# Patient Record
Sex: Male | Born: 1955
Health system: Southern US, Community
[De-identification: ages and names within clinical notes are randomized; demographics above are authoritative.]

## PROBLEM LIST (undated history)

## (undated) DIAGNOSIS — E669 Obesity, unspecified: Secondary | ICD-10-CM

## (undated) DIAGNOSIS — K635 Polyp of colon: Secondary | ICD-10-CM

## (undated) DIAGNOSIS — E119 Type 2 diabetes mellitus without complications: Secondary | ICD-10-CM

## (undated) DIAGNOSIS — M549 Dorsalgia, unspecified: Secondary | ICD-10-CM

## (undated) DIAGNOSIS — M4810 Ankylosing hyperostosis [Forestier], site unspecified: Secondary | ICD-10-CM

## (undated) DIAGNOSIS — F329 Major depressive disorder, single episode, unspecified: Secondary | ICD-10-CM

## (undated) DIAGNOSIS — I519 Heart disease, unspecified: Secondary | ICD-10-CM

## (undated) DIAGNOSIS — I10 Essential (primary) hypertension: Secondary | ICD-10-CM

## (undated) DIAGNOSIS — I4892 Unspecified atrial flutter: Secondary | ICD-10-CM

## (undated) DIAGNOSIS — F32A Depression, unspecified: Secondary | ICD-10-CM

## (undated) DIAGNOSIS — G4730 Sleep apnea, unspecified: Secondary | ICD-10-CM

## (undated) HISTORY — DX: Dorsalgia, unspecified: M54.9

## (undated) HISTORY — DX: Major depressive disorder, single episode, unspecified: F32.9

## (undated) HISTORY — DX: Depression, unspecified: F32.A

## (undated) HISTORY — DX: Polyp of colon: K63.5

## (undated) HISTORY — DX: Heart disease, unspecified: I51.9

## (undated) HISTORY — DX: Obesity, unspecified: E66.9

## (undated) HISTORY — PX: COLONOSCOPY: SHX174

## (undated) HISTORY — DX: Ankylosing hyperostosis (forestier), site unspecified: M48.10

## (undated) HISTORY — DX: Type 2 diabetes mellitus without complications: E11.9

---

## 2014-05-07 DIAGNOSIS — E785 Hyperlipidemia, unspecified: Secondary | ICD-10-CM | POA: Insufficient documentation

## 2014-08-07 DIAGNOSIS — N529 Male erectile dysfunction, unspecified: Secondary | ICD-10-CM | POA: Insufficient documentation

## 2015-02-18 DIAGNOSIS — I480 Paroxysmal atrial fibrillation: Secondary | ICD-10-CM | POA: Insufficient documentation

## 2015-05-09 DIAGNOSIS — R319 Hematuria, unspecified: Secondary | ICD-10-CM | POA: Insufficient documentation

## 2015-05-09 DIAGNOSIS — N281 Cyst of kidney, acquired: Secondary | ICD-10-CM | POA: Insufficient documentation

## 2015-05-09 DIAGNOSIS — N200 Calculus of kidney: Secondary | ICD-10-CM | POA: Insufficient documentation

## 2015-10-12 ENCOUNTER — Emergency Department (HOSPITAL_BASED_OUTPATIENT_CLINIC_OR_DEPARTMENT_OTHER)
Admission: EM | Admit: 2015-10-12 | Discharge: 2015-10-12 | Disposition: A | Discharge status: Home / Self Care | Payer: BLUE CROSS/BLUE SHIELD | Attending: Emergency Medicine | Admitting: Emergency Medicine

## 2015-10-12 ENCOUNTER — Encounter (HOSPITAL_BASED_OUTPATIENT_CLINIC_OR_DEPARTMENT_OTHER): Payer: Self-pay | Admitting: *Deleted

## 2015-10-12 DIAGNOSIS — E119 Type 2 diabetes mellitus without complications: Secondary | ICD-10-CM | POA: Diagnosis not present

## 2015-10-12 DIAGNOSIS — I10 Essential (primary) hypertension: Secondary | ICD-10-CM | POA: Diagnosis not present

## 2015-10-12 DIAGNOSIS — Z79899 Other long term (current) drug therapy: Secondary | ICD-10-CM | POA: Insufficient documentation

## 2015-10-12 DIAGNOSIS — R21 Rash and other nonspecific skin eruption: Secondary | ICD-10-CM | POA: Insufficient documentation

## 2015-10-12 DIAGNOSIS — Z8669 Personal history of other diseases of the nervous system and sense organs: Secondary | ICD-10-CM | POA: Diagnosis not present

## 2015-10-12 DIAGNOSIS — Z720 Tobacco use: Secondary | ICD-10-CM | POA: Insufficient documentation

## 2015-10-12 HISTORY — DX: Essential (primary) hypertension: I10

## 2015-10-12 HISTORY — DX: Unspecified atrial flutter: I48.92

## 2015-10-12 HISTORY — DX: Type 2 diabetes mellitus without complications: E11.9

## 2015-10-12 HISTORY — DX: Sleep apnea, unspecified: G47.30

## 2015-10-12 MED ORDER — FLUORESCEIN SODIUM 1 MG OP STRP
1.0000 | ORAL_STRIP | Freq: Once | OPHTHALMIC | Status: AC
  Administered 2015-10-12: 1 via OPHTHALMIC
  Filled 2015-10-12: qty 1

## 2015-10-12 MED ORDER — VALACYCLOVIR HCL 1 G PO TABS: 1000.0000 mg | ORAL_TABLET | Freq: Three times a day (TID) | ORAL | Status: DC

## 2015-10-12 MED ORDER — PROPARACAINE HCL 0.5 % OP SOLN
1.0000 [drp] | Freq: Once | OPHTHALMIC | Status: AC
  Administered 2015-10-12: 1 [drp] via OPHTHALMIC
  Filled 2015-10-12: qty 15

## 2015-10-12 MED ORDER — VALACYCLOVIR HCL 500 MG PO TABS
1000.0000 mg | ORAL_TABLET | Freq: Once | ORAL | Status: AC
  Administered 2015-10-12: 1000 mg via ORAL
  Filled 2015-10-12: qty 2

## 2015-10-12 NOTE — Discharge Instructions (Signed)
Do not hesitate to return to the emergency room for any new, worsening or concerning symptoms. ° °Please obtain primary care using resource guide below. Let them know that you were seen in the emergency room and that they will need to obtain records for further outpatient management. ° ° °

## 2015-10-12 NOTE — ED Provider Notes (Signed)
CSN: [id number]     Arrival date & time 10/12/15  1836 History   First MD Initiated Contact with Patient 10/12/15 1901     Chief Complaint  Patient presents with  . Rash     (Consider location/radiation/quality/duration/timing/severity/associated sxs/prior Treatment) HPI   Blood pressure 180/77, pulse 59, temperature 98.9 F (37.2 C), temperature source Oral, resp. rate 20, height  (1.778 m), weight 290 lb (131.543 kg), SpO2 99 %.  BHARATH BERNSTEIN is a 59 y.o. male complaining of painful and pruritic rash which she noticed on the crown of his head approximately 6 days ago. Pain is moderate, 5 out of 10 and alleviated with acetaminophen. States that he felt that his left eye is dry and he attributed that to a motorcycle ride he had been on. Is been applying over-the-counter eye moistening drops with little relief. He was applying hydrogen peroxide to the rash initially  because he thought was a bug bite. Lesions have spread downward to the left eyelid. Patient denies any change in his vision. Had chickenpox as a child. Denies fever, chills, nausea, vomiting.   Past Medical History  Diagnosis Date  . Hypertension   . Atrial flutter (HCC)   . Sleep apnea   . Diabetes mellitus without complication [facility])    Past Surgical History  Procedure Laterality Date  . Colonoscopy     No family history on file. Social History  Substance Use Topics  . Smoking status: Current Some Day Smoker    Types: Cigars  . Smokeless tobacco: Never Used  . Alcohol Use: Yes     Comment: 3x week    Review of Systems  10 systems reviewed and found to be negative, except as noted in the HPI.   Allergies  Review of patient's allergies indicates no known allergies.  Home Medications   Prior to Admission medications   Medication Sig Start Date End Date Taking? Authorizing Provider  Apixaban (ELIQUIS PO) Take by mouth.   Yes Historical Provider, MD  sitaGLIPtin-metformin (JANUMET) 50-1000 MG  tablet Take 1 tablet by mouth daily.   Yes Historical Provider, MD   BP 180/77 mmHg  Pulse 59  Temp(Src) 98.9 F (37.2 C) (Oral)  Resp 20  Ht  (1.778 m)  Wt 290 lb (131.543 kg)  BMI 41.61 kg/m2  SpO2 99% Physical Exam  Constitutional: He is oriented to person, place, and time. He appears well-developed and well-nourished. No distress.  HENT:  Head: Normocephalic.    Eyes: Conjunctivae, EOM and lids are normal. Pupils are equal, round, and reactive to light.  No ferning or abnormal uptake on fluorescein stain of the left eye  Cardiovascular: Normal rate.   Pulmonary/Chest: Effort normal. No stridor.  Musculoskeletal: Normal range of motion.  Neurological: He is alert and oriented to person, place, and time.  Skin: Rash noted.  Lesions spare the palms soles or mucous membranes.  Psychiatric: He has a normal mood and affect.  Nursing note and vitals reviewed.   ED Course  Procedures (including critical care time) Labs Review Labs Reviewed - No data to display  Imaging Review No results found. I have personally reviewed and evaluated these images and lab results as part of my medical decision-making.   EKG Interpretation None      MDM   Final diagnoses:  Rash and nonspecific skin eruption    Filed Vitals:   10/12/15 1842  BP: 180/77  Pulse: 59  Temp: 98.9 F (37.2 C)  TempSrc: Oral  Resp: 20  Height: 5\' 10"  (1.778 m)  Weight: 290 lb (131.543 kg)  SpO2: 99%    Medications  proparacaine (ALCAINE) 0.5 % ophthalmic solution 1 drop (1 drop Left Eye Given 10/12/15 1945)  fluorescein ophthalmic strip 1 strip (1 strip Left Eye Given 10/12/15 1946)  valACYclovir (VALTREX) tablet 1,000 mg (1,000 mg Oral Given 10/12/15 1944)    Andria RheinJohn W Rodrigue is 59 y.o. male presenting with mild to moderate painful rash starting on the left frontal area. Patient has sensation of dry left eye and also he has a hive-like lesion on the left eyelid. No lesions on the nose or in  the ear canal. Fluorescein stain of the left eye without ferning. Patient's pain is mild, this may be shingles but I think it's unlikely. Patient will be started on Valtrex. States that [place] moved to [place], [place] [facility] [place] [facility].  Evaluation does not show pathology that would require ongoing emergent intervention or inpatient treatment. Pt is hemodynamically stable and mentating appropriately. Discussed findings and plan with patient/guardian, who agrees with care plan. All questions answered. Return precautions discussed and outpatient follow up given.   New Prescriptions   VALACYCLOVIR (VALTREX) 1000 MG TABLET    Take 1 tablet (1,000 mg total) by mouth 3 (three) times daily.         Wynetta Emeryicole Pandora Mccrackin, PA-C 10/12/15 1950  Rolan BuccoMelanie Belfi, MD 10/12/15 [phone]

## 2015-10-12 NOTE — ED Notes (Signed)
Head pain and rash x 6 days

## 2015-10-18 ENCOUNTER — Encounter: Payer: Self-pay | Admitting: Medical

## 2015-10-18 ENCOUNTER — Encounter (INDEPENDENT_AMBULATORY_CARE_PROVIDER_SITE_OTHER): Payer: Self-pay

## 2015-10-18 ENCOUNTER — Ambulatory Visit (INDEPENDENT_AMBULATORY_CARE_PROVIDER_SITE_OTHER): Payer: BLUE CROSS/BLUE SHIELD | Admitting: Medical

## 2015-10-18 VITALS — BP 120/80 | HR 67 | Temp 97.8°F | Wt 295.0 lb

## 2015-10-18 DIAGNOSIS — G629 Polyneuropathy, unspecified: Secondary | ICD-10-CM

## 2015-10-18 DIAGNOSIS — B029 Zoster without complications: Secondary | ICD-10-CM

## 2015-10-18 DIAGNOSIS — G4730 Sleep apnea, unspecified: Secondary | ICD-10-CM

## 2015-10-18 DIAGNOSIS — M792 Neuralgia and neuritis, unspecified: Secondary | ICD-10-CM

## 2015-10-18 DIAGNOSIS — I10 Essential (primary) hypertension: Secondary | ICD-10-CM | POA: Insufficient documentation

## 2015-10-18 MED ORDER — TRAMADOL HCL 50 MG PO TABS: 50.0000 mg | ORAL_TABLET | Freq: Three times a day (TID) | ORAL | Status: DC | PRN

## 2015-10-18 NOTE — Progress Notes (Signed)
Pre visit review using our clinic review tool, if applicable. No additional management support is needed unless otherwise documented below in the visit note. 

## 2015-10-18 NOTE — Assessment & Plan Note (Signed)
Will give tramadol if needed for severe pain. Maybe just use at night.  Also we could give med such as neurontin if daily pain not improving.

## 2015-10-18 NOTE — Assessment & Plan Note (Signed)
-   Continue amlodipine ?

## 2015-10-18 NOTE — Progress Notes (Signed)
Subjective:    Patient ID: Luke Sims, male    DOB: October 16, 1956, 59 y.o.   MRN: [id number]  HPI  Pt in with some recent shingles. Pt had shingles on top of his scalp. Pt had some radiation from top of his scalp around toward his ear toward his lt cheek(this mild pain that comes and goes). No eye pain. No visual distrubance.. Pt has been on Valtrex since Saturday. Pt had mild rash for about a week.   Pt states he get occasional sharp pain at times. Level of pain is about 4/10 on average.    Pt given 15 day course of famvir.  Pt has seen Dr. Loni Musepthalmologist Sims. Eye was ok per pt.  Pt is diabetic- pt is on jaumet. Last a1-c was 7.1.  Pt has history of atrial flutter- end of march. Had cardioversion. One follow up with cardiologist.  Sees [facility] cardiologist. Luke Sims. [phone].  Sleep apnea- Pt uses cpap for 17 yrs.  Colonosocpy- Pt states small polyps. Told to repeat in 3 years.  Pt works at [employer], exercise walk/run intervals, 1 cup decaffeinated a day, 3 cigars a month, married- 3 children.    Review of Systems  Constitutional: Negative for fever, chills and fatigue.  Respiratory: Negative for cough, chest tightness, shortness of breath and wheezing.   Cardiovascular: Negative for chest pain and palpitations.  Gastrointestinal: Negative for abdominal pain.  Musculoskeletal: Negative for back pain.  Neurological: Negative for dizziness and headaches.       Neuroptahy type pain on scalp.  Hematological: Negative for adenopathy. Does not bruise/bleed easily.  Psychiatric/Behavioral: Negative for confusion and agitation.    Past Medical History  Diagnosis Date  . Hypertension   . Atrial flutter (HCC)   . Sleep apnea   . Diabetes mellitus without complication [facility](HCC)     Social History   Social History  . Marital Status: Married    Spouse Name: N/A  . Number of Children: N/A  . Years of Education: N/A   Occupational History  . Not on file.    Social History Main Topics  . Smoking status: Current Some Day Smoker    Types: Cigars  . Smokeless tobacco: Never Used  . Alcohol Use: Yes     Comment: 3x week  . Drug Use: No  . Sexual Activity: Not on file   Other Topics Concern  . Not on file   Social History Narrative    Past Surgical History  Procedure Laterality Date  . Colonoscopy      Family History  Problem Relation Age of Onset  . Diabetes Mother   . Heart disease Father   . Hyperlipidemia Father   . Diabetes Sister   . COPD Brother     No Known Allergies  Current Outpatient Prescriptions on File Prior to Visit  Medication Sig Dispense Refill  . sitaGLIPtin-metformin (JANUMET) 50-1000 MG tablet Take 1 tablet by mouth daily.    . valACYclovir (VALTREX) 1000 MG tablet Take 1 tablet (1,000 mg total) by mouth 3 (three) times daily. 21 tablet 0   No current facility-administered medications on file prior to visit.    BP 120/80 mmHg  Pulse 67  Temp(Src) 97.8 F (36.6 C) (Oral)  Wt 295 lb (133.811 kg)  SpO2 98%       Objective:   Physical Exam  General Mental Status- Alert. General Appearance- Not in acute distress.   Skin General: Color- Normal Color. Moisture- Normal Moisture. Only faint  dry appearance to scalp flaky appearance,  As well as faint flaky skin appearance upper eye lid  and above eye brow. No vesicles.  Neck Carotid Arteries- Normal color. Moisture- Normal Moisture. No carotid bruits. No JVD.  Chest and Lung Exam Auscultation: Breath Sounds:-Normal.  Cardiovascular Auscultation:Rythm- Regular. Murmurs & Other Heart Sounds:Auscultation of the heart reveals- No Murmurs.  Neurologic Cranial Nerve exam:- CN III-XII intact(No nystagmus), symmetric smile. Strength:- 5/5 equal and symmetric strength both upper and lower extremities.      Assessment & Plan:  Continue with the famvir. Follow up eye MD.  Will give tramadol if needed for severe pain. Maybe just use at  night.  Also we could give med such as neurontin if daily pain not improving.  zostavax  In near future after he gets over acute illness. Also he will talk with his insurance make sure they will cover.  Follow up in 3-4 weeks or as needed  Pt will continue meds for his chronic problems.

## 2015-10-18 NOTE — Assessment & Plan Note (Signed)
Continue with the famvir. Follow up eye MD.

## 2015-10-18 NOTE — Assessment & Plan Note (Signed)
Pt uses cpap for 17 yrs.

## 2015-10-18 NOTE — Patient Instructions (Signed)
Continue with the famvir. Follow up eye MD.  Will give tramadol if needed for severe pain. Maybe just use at night.  Also we could give med such as neurontin if daily pain not improving.  Follow up in 3-4 weeks or as needed

## 2015-11-07 ENCOUNTER — Ambulatory Visit: Payer: BLUE CROSS/BLUE SHIELD | Admitting: Medical

## 2016-01-03 DIAGNOSIS — K760 Fatty (change of) liver, not elsewhere classified: Secondary | ICD-10-CM | POA: Insufficient documentation

## 2016-01-03 DIAGNOSIS — Z8679 Personal history of other diseases of the circulatory system: Secondary | ICD-10-CM | POA: Insufficient documentation

## 2016-01-03 DIAGNOSIS — E785 Hyperlipidemia, unspecified: Secondary | ICD-10-CM | POA: Insufficient documentation

## 2016-01-03 DIAGNOSIS — E1169 Type 2 diabetes mellitus with other specified complication: Secondary | ICD-10-CM | POA: Insufficient documentation

## 2016-01-03 DIAGNOSIS — K8020 Calculus of gallbladder without cholecystitis without obstruction: Secondary | ICD-10-CM | POA: Insufficient documentation

## 2016-01-03 DIAGNOSIS — E291 Testicular hypofunction: Secondary | ICD-10-CM | POA: Insufficient documentation

## 2016-01-03 DIAGNOSIS — E559 Vitamin D deficiency, unspecified: Secondary | ICD-10-CM | POA: Insufficient documentation

## 2016-01-03 DIAGNOSIS — E041 Nontoxic single thyroid nodule: Secondary | ICD-10-CM | POA: Insufficient documentation

## 2016-01-24 ENCOUNTER — Ambulatory Visit: Payer: BLUE CROSS/BLUE SHIELD | Admitting: Family Medicine

## 2016-07-17 DIAGNOSIS — R809 Proteinuria, unspecified: Secondary | ICD-10-CM | POA: Insufficient documentation

## 2016-07-17 DIAGNOSIS — E1129 Type 2 diabetes mellitus with other diabetic kidney complication: Secondary | ICD-10-CM | POA: Insufficient documentation

## 2016-10-26 ENCOUNTER — Other Ambulatory Visit: Payer: Self-pay | Admitting: Chiropractic Medicine

## 2016-10-26 ENCOUNTER — Ambulatory Visit
Admission: RE | Admit: 2016-10-26 | Discharge: 2016-10-26 | Disposition: A | Discharge status: Home / Self Care | Payer: BLUE CROSS/BLUE SHIELD | Source: Ambulatory Visit | Attending: Chiropractic Medicine | Admitting: Chiropractic Medicine

## 2016-10-26 DIAGNOSIS — W19XXXA Unspecified fall, initial encounter: Secondary | ICD-10-CM

## 2016-11-13 ENCOUNTER — Other Ambulatory Visit: Payer: Self-pay | Admitting: Chiropractic Medicine

## 2016-11-13 ENCOUNTER — Ambulatory Visit
Admission: RE | Admit: 2016-11-13 | Discharge: 2016-11-13 | Disposition: A | Discharge status: Home / Self Care | Payer: BLUE CROSS/BLUE SHIELD | Source: Ambulatory Visit | Attending: Chiropractic Medicine | Admitting: Chiropractic Medicine

## 2016-11-13 DIAGNOSIS — W19XXXA Unspecified fall, initial encounter: Secondary | ICD-10-CM

## 2016-12-04 ENCOUNTER — Other Ambulatory Visit: Payer: Self-pay | Admitting: Chiropractic Medicine

## 2016-12-04 DIAGNOSIS — M545 Low back pain: Secondary | ICD-10-CM

## 2016-12-14 ENCOUNTER — Ambulatory Visit
Admission: RE | Admit: 2016-12-14 | Discharge: 2016-12-14 | Disposition: A | Discharge status: Home / Self Care | Payer: BLUE CROSS/BLUE SHIELD | Source: Ambulatory Visit | Attending: Chiropractic Medicine | Admitting: Chiropractic Medicine

## 2016-12-14 DIAGNOSIS — M545 Low back pain: Secondary | ICD-10-CM

## 2016-12-28 HISTORY — PX: CARDIAC CATHETERIZATION: SHX172

## 2017-01-07 DIAGNOSIS — M4810 Ankylosing hyperostosis [Forestier], site unspecified: Secondary | ICD-10-CM | POA: Insufficient documentation

## 2017-01-07 DIAGNOSIS — K439 Ventral hernia without obstruction or gangrene: Secondary | ICD-10-CM | POA: Insufficient documentation

## 2017-01-14 DIAGNOSIS — K148 Other diseases of tongue: Secondary | ICD-10-CM | POA: Insufficient documentation

## 2017-01-18 DIAGNOSIS — Z955 Presence of coronary angioplasty implant and graft: Secondary | ICD-10-CM | POA: Insufficient documentation

## 2017-02-08 ENCOUNTER — Telehealth (HOSPITAL_COMMUNITY): Payer: Self-pay | Admitting: *Deleted

## 2017-02-08 NOTE — Telephone Encounter (Signed)
Returned call from message pt left earlier today for cardiac rehab. Unsure [facility]. Contact information provided.  Await return call. Alanson Alyarlette Elius Etheredge RN, BSN

## 2017-03-09 ENCOUNTER — Telehealth (HOSPITAL_COMMUNITY): Payer: Self-pay | Admitting: *Deleted

## 2017-03-09 NOTE — Telephone Encounter (Signed)
Follow up phone call placed to pt.  Pt indicated he is exercising at [facility].  No further needs. Alanson Alyarlette Carlton RN, BSN

## 2018-03-29 DIAGNOSIS — R0789 Other chest pain: Secondary | ICD-10-CM | POA: Insufficient documentation

## 2018-04-14 DIAGNOSIS — R109 Unspecified abdominal pain: Secondary | ICD-10-CM | POA: Insufficient documentation

## 2018-04-14 DIAGNOSIS — G4701 Insomnia due to medical condition: Secondary | ICD-10-CM | POA: Insufficient documentation

## 2018-04-14 DIAGNOSIS — T148XXA Other injury of unspecified body region, initial encounter: Secondary | ICD-10-CM | POA: Insufficient documentation

## 2018-05-04 DIAGNOSIS — F17290 Nicotine dependence, other tobacco product, uncomplicated: Secondary | ICD-10-CM | POA: Diagnosis not present

## 2018-05-04 DIAGNOSIS — E669 Obesity, unspecified: Secondary | ICD-10-CM | POA: Diagnosis not present

## 2018-05-04 DIAGNOSIS — R5383 Other fatigue: Secondary | ICD-10-CM | POA: Diagnosis not present

## 2018-05-04 DIAGNOSIS — R0683 Snoring: Secondary | ICD-10-CM | POA: Diagnosis not present

## 2018-05-04 DIAGNOSIS — Z7984 Long term (current) use of oral hypoglycemic drugs: Secondary | ICD-10-CM | POA: Diagnosis not present

## 2018-05-04 DIAGNOSIS — E119 Type 2 diabetes mellitus without complications: Secondary | ICD-10-CM | POA: Diagnosis not present

## 2018-05-04 DIAGNOSIS — Z5189 Encounter for other specified aftercare: Secondary | ICD-10-CM | POA: Diagnosis not present

## 2018-05-04 DIAGNOSIS — G4730 Sleep apnea, unspecified: Secondary | ICD-10-CM | POA: Diagnosis not present

## 2018-05-04 DIAGNOSIS — E1129 Type 2 diabetes mellitus with other diabetic kidney complication: Secondary | ICD-10-CM | POA: Diagnosis not present

## 2018-05-04 DIAGNOSIS — E041 Nontoxic single thyroid nodule: Secondary | ICD-10-CM | POA: Diagnosis not present

## 2018-05-04 DIAGNOSIS — E042 Nontoxic multinodular goiter: Secondary | ICD-10-CM | POA: Diagnosis not present

## 2018-05-04 DIAGNOSIS — Z6841 Body Mass Index (BMI) 40.0 and over, adult: Secondary | ICD-10-CM | POA: Diagnosis not present

## 2018-05-04 DIAGNOSIS — E1169 Type 2 diabetes mellitus with other specified complication: Secondary | ICD-10-CM | POA: Diagnosis not present

## 2018-05-05 DIAGNOSIS — M545 Low back pain: Secondary | ICD-10-CM | POA: Diagnosis not present

## 2018-05-05 DIAGNOSIS — J90 Pleural effusion, not elsewhere classified: Secondary | ICD-10-CM | POA: Insufficient documentation

## 2018-05-05 DIAGNOSIS — M546 Pain in thoracic spine: Secondary | ICD-10-CM | POA: Diagnosis not present

## 2018-05-20 DIAGNOSIS — R112 Nausea with vomiting, unspecified: Secondary | ICD-10-CM | POA: Diagnosis not present

## 2018-05-20 DIAGNOSIS — R42 Dizziness and giddiness: Secondary | ICD-10-CM | POA: Diagnosis not present

## 2018-05-23 DIAGNOSIS — I4510 Unspecified right bundle-branch block: Secondary | ICD-10-CM | POA: Diagnosis not present

## 2018-05-23 DIAGNOSIS — R9431 Abnormal electrocardiogram [ECG] [EKG]: Secondary | ICD-10-CM | POA: Diagnosis not present

## 2018-06-09 DIAGNOSIS — H6981 Other specified disorders of Eustachian tube, right ear: Secondary | ICD-10-CM | POA: Diagnosis not present

## 2018-06-09 DIAGNOSIS — E119 Type 2 diabetes mellitus without complications: Secondary | ICD-10-CM | POA: Diagnosis not present

## 2018-06-09 DIAGNOSIS — H9313 Tinnitus, bilateral: Secondary | ICD-10-CM | POA: Diagnosis not present

## 2018-06-09 DIAGNOSIS — H903 Sensorineural hearing loss, bilateral: Secondary | ICD-10-CM | POA: Diagnosis not present

## 2018-06-23 DIAGNOSIS — Z7984 Long term (current) use of oral hypoglycemic drugs: Secondary | ICD-10-CM | POA: Diagnosis not present

## 2018-06-23 DIAGNOSIS — H5213 Myopia, bilateral: Secondary | ICD-10-CM | POA: Diagnosis not present

## 2018-06-23 DIAGNOSIS — H2513 Age-related nuclear cataract, bilateral: Secondary | ICD-10-CM | POA: Diagnosis not present

## 2018-06-23 DIAGNOSIS — E119 Type 2 diabetes mellitus without complications: Secondary | ICD-10-CM | POA: Diagnosis not present

## 2018-07-05 DIAGNOSIS — E1129 Type 2 diabetes mellitus with other diabetic kidney complication: Secondary | ICD-10-CM | POA: Diagnosis not present

## 2018-07-05 DIAGNOSIS — E785 Hyperlipidemia, unspecified: Secondary | ICD-10-CM | POA: Diagnosis not present

## 2018-07-05 DIAGNOSIS — R809 Proteinuria, unspecified: Secondary | ICD-10-CM | POA: Diagnosis not present

## 2018-07-05 DIAGNOSIS — E1169 Type 2 diabetes mellitus with other specified complication: Secondary | ICD-10-CM | POA: Diagnosis not present

## 2018-08-25 DIAGNOSIS — H9313 Tinnitus, bilateral: Secondary | ICD-10-CM | POA: Diagnosis not present

## 2018-08-25 DIAGNOSIS — H903 Sensorineural hearing loss, bilateral: Secondary | ICD-10-CM | POA: Diagnosis not present

## 2018-08-25 DIAGNOSIS — H8101 Meniere's disease, right ear: Secondary | ICD-10-CM | POA: Diagnosis not present

## 2018-08-25 DIAGNOSIS — H6981 Other specified disorders of Eustachian tube, right ear: Secondary | ICD-10-CM | POA: Diagnosis not present

## 2018-09-08 DIAGNOSIS — H8101 Meniere's disease, right ear: Secondary | ICD-10-CM | POA: Diagnosis not present

## 2018-09-08 DIAGNOSIS — H903 Sensorineural hearing loss, bilateral: Secondary | ICD-10-CM | POA: Diagnosis not present

## 2018-09-08 DIAGNOSIS — H9313 Tinnitus, bilateral: Secondary | ICD-10-CM | POA: Diagnosis not present

## 2018-09-08 DIAGNOSIS — E119 Type 2 diabetes mellitus without complications: Secondary | ICD-10-CM | POA: Diagnosis not present

## 2018-10-12 DIAGNOSIS — M9903 Segmental and somatic dysfunction of lumbar region: Secondary | ICD-10-CM | POA: Diagnosis not present

## 2018-10-12 DIAGNOSIS — M4810 Ankylosing hyperostosis [Forestier], site unspecified: Secondary | ICD-10-CM | POA: Diagnosis not present

## 2018-10-12 DIAGNOSIS — M9904 Segmental and somatic dysfunction of sacral region: Secondary | ICD-10-CM | POA: Diagnosis not present

## 2018-10-12 DIAGNOSIS — M459 Ankylosing spondylitis of unspecified sites in spine: Secondary | ICD-10-CM | POA: Diagnosis not present

## 2018-11-10 DIAGNOSIS — M459 Ankylosing spondylitis of unspecified sites in spine: Secondary | ICD-10-CM | POA: Diagnosis not present

## 2018-11-10 DIAGNOSIS — M9903 Segmental and somatic dysfunction of lumbar region: Secondary | ICD-10-CM | POA: Diagnosis not present

## 2018-11-10 DIAGNOSIS — M4810 Ankylosing hyperostosis [Forestier], site unspecified: Secondary | ICD-10-CM | POA: Diagnosis not present

## 2018-11-10 DIAGNOSIS — M9904 Segmental and somatic dysfunction of sacral region: Secondary | ICD-10-CM | POA: Diagnosis not present

## 2018-12-15 DIAGNOSIS — M459 Ankylosing spondylitis of unspecified sites in spine: Secondary | ICD-10-CM | POA: Diagnosis not present

## 2018-12-15 DIAGNOSIS — M9904 Segmental and somatic dysfunction of sacral region: Secondary | ICD-10-CM | POA: Diagnosis not present

## 2018-12-15 DIAGNOSIS — M4810 Ankylosing hyperostosis [Forestier], site unspecified: Secondary | ICD-10-CM | POA: Diagnosis not present

## 2018-12-15 DIAGNOSIS — M9903 Segmental and somatic dysfunction of lumbar region: Secondary | ICD-10-CM | POA: Diagnosis not present

## 2019-01-05 DIAGNOSIS — R809 Proteinuria, unspecified: Secondary | ICD-10-CM | POA: Diagnosis not present

## 2019-01-05 DIAGNOSIS — Z6841 Body Mass Index (BMI) 40.0 and over, adult: Secondary | ICD-10-CM | POA: Diagnosis not present

## 2019-01-05 DIAGNOSIS — Z5189 Encounter for other specified aftercare: Secondary | ICD-10-CM | POA: Insufficient documentation

## 2019-01-05 DIAGNOSIS — Z7984 Long term (current) use of oral hypoglycemic drugs: Secondary | ICD-10-CM | POA: Diagnosis not present

## 2019-01-05 DIAGNOSIS — E1165 Type 2 diabetes mellitus with hyperglycemia: Secondary | ICD-10-CM | POA: Diagnosis not present

## 2019-01-05 DIAGNOSIS — I2510 Atherosclerotic heart disease of native coronary artery without angina pectoris: Secondary | ICD-10-CM | POA: Diagnosis not present

## 2019-01-05 DIAGNOSIS — G4733 Obstructive sleep apnea (adult) (pediatric): Secondary | ICD-10-CM | POA: Diagnosis not present

## 2019-01-05 DIAGNOSIS — F17290 Nicotine dependence, other tobacco product, uncomplicated: Secondary | ICD-10-CM | POA: Diagnosis not present

## 2019-01-05 DIAGNOSIS — L84 Corns and callosities: Secondary | ICD-10-CM | POA: Diagnosis not present

## 2019-01-05 DIAGNOSIS — E1129 Type 2 diabetes mellitus with other diabetic kidney complication: Secondary | ICD-10-CM | POA: Diagnosis not present

## 2019-01-05 DIAGNOSIS — E042 Nontoxic multinodular goiter: Secondary | ICD-10-CM | POA: Diagnosis not present

## 2019-01-05 DIAGNOSIS — E041 Nontoxic single thyroid nodule: Secondary | ICD-10-CM | POA: Diagnosis not present

## 2019-01-12 DIAGNOSIS — M9903 Segmental and somatic dysfunction of lumbar region: Secondary | ICD-10-CM | POA: Diagnosis not present

## 2019-01-12 DIAGNOSIS — M9904 Segmental and somatic dysfunction of sacral region: Secondary | ICD-10-CM | POA: Diagnosis not present

## 2019-01-12 DIAGNOSIS — M459 Ankylosing spondylitis of unspecified sites in spine: Secondary | ICD-10-CM | POA: Diagnosis not present

## 2019-01-12 DIAGNOSIS — M4810 Ankylosing hyperostosis [Forestier], site unspecified: Secondary | ICD-10-CM | POA: Diagnosis not present

## 2019-01-25 ENCOUNTER — Ambulatory Visit: Payer: BLUE CROSS/BLUE SHIELD | Admitting: Family Medicine

## 2019-01-25 ENCOUNTER — Encounter: Payer: Self-pay | Admitting: Family Medicine

## 2019-01-25 DIAGNOSIS — I2510 Atherosclerotic heart disease of native coronary artery without angina pectoris: Secondary | ICD-10-CM

## 2019-01-25 DIAGNOSIS — E1169 Type 2 diabetes mellitus with other specified complication: Secondary | ICD-10-CM | POA: Diagnosis not present

## 2019-01-25 DIAGNOSIS — E119 Type 2 diabetes mellitus without complications: Secondary | ICD-10-CM | POA: Insufficient documentation

## 2019-01-25 DIAGNOSIS — E669 Obesity, unspecified: Secondary | ICD-10-CM

## 2019-01-25 NOTE — Progress Notes (Signed)
Chief Complaint  Patient presents with  . New Patient (Initial Visit)       New Patient Visit SUBJECTIVE: HPI: Luke Sims is an 63 y.o.male who is being seen for establishing care.  The patient was previously seen at [facility].  Patient has a history of type 2 diabetes.  He is currently managed by endocrinology.  He is on metformin, Jardiance, and Victoza.  His diet is poor and he does not exercise routinely.  He has a history of heart disease for which he follows cardiology for.  He does not require any medication from his primary care physician.  He has a history of morbid obesity.  He left his previous provider because bariatric surgery was pushed onto them.  He has seen several nutritionist across the years and did not have much benefit.  When he gets motivated, he does do well.  He does have difficulty finding motivation though.  No Known Allergies  Past Medical History:  Diagnosis Date  . Atrial flutter (HCC)   . Colon polyps   . Depression   . Diabetes mellitus without complication (HCC)   . Heart disease   . Hypertension   . Sleep apnea    Past Surgical History:  Procedure Laterality Date  . COLONOSCOPY     Family History  Problem Relation Age of Onset  . Diabetes Mother   . Heart disease Father   . Hyperlipidemia Father   . Diabetes Sister   . COPD Brother   . Hypertension Son   . Arthritis Sister   . Arthritis Sister    No Known Allergies  Current Outpatient Medications:  .  amLODipine (NORVASC) 5 MG tablet, TAKE 1 TABLET BY MOUTH DAILY, Disp: , Rfl:  .  apixaban (ELIQUIS) 5 MG TABS tablet, Take 5 mg by mouth daily., Disp: , Rfl:  .  aspirin EC 81 MG tablet, Take 81 mg by mouth daily., Disp: , Rfl:  .  empagliflozin (JARDIANCE) 10 MG TABS tablet, Take 10 mg by mouth daily., Disp: , Rfl:  .  liraglutide (VICTOZA) 18 MG/3ML SOPN, Inject 1.8 mg into the skin daily., Disp: , Rfl:  .  metFORMIN (GLUMETZA) 1000 MG (MOD) 24 hr tablet, Take 2 tablets twice daily,  Disp: , Rfl:  .  NON FORMULARY, Apple cider Vin complex (organic apple cider, Tumeric, ginger, Ceylon cinnamon and lemon), Disp: , Rfl:  .  rosuvastatin (CRESTOR) 20 MG tablet, Take 20 mg by mouth daily., Disp: , Rfl:  .  valsartan-hydrochlorothiazide (DIOVAN-HCT) 320-25 MG tablet, Take by mouth., Disp: , Rfl:   ROS Cardiovascular: Denies chest pain  Respiratory: Denies dyspnea   OBJECTIVE: BP 112/70 (BP Location: Left Arm, Patient Position: Sitting, Cuff Size: Large)   Pulse 73   Temp 99.1 F (37.3 C) (Oral)   Ht 5\' 10"  (1.778 m)   Wt 288 lb 6 oz (130.8 kg)   SpO2 97%   BMI 41.38 kg/m   Constitutional: -  VS reviewed -  Well developed, well nourished, appears stated age -  No apparent distress  Psychiatric: -  Oriented to person, place, and time -  Memory intact -  Affect and mood normal -  Fluent conversation, good eye contact -  Judgment and insight age appropriate  Eye: -  Conjunctivae clear, no discharge -  Pupils symmetric, round, reactive to light  ENMT: -  MMM    Pharynx moist, no exudate, no erythema  Neck: -  No gross swelling, no palpable masses -  Thyroid midline, not enlarged, mobile, no palpable masses  Cardiovascular: -  RRR -  No LE edema  Respiratory: -  Normal respiratory effort, no accessory muscle use, no retraction -  Breath sounds equal, no wheezes, no ronchi, no crackles  Gastrointestinal: -  Bowel sounds normal -  No tenderness, no distention, no guarding, no masses  Neurological:  -  CN II - XII grossly intact -  Sensation grossly intact to light touch, equal bilaterally  Musculoskeletal: -  No clubbing, no cyanosis -  Gait normal  Skin: -  No significant lesion on inspection -  Warm and dry to palpation   ASSESSMENT/PLAN: Morbid obesity (HCC) - Plan: Amb Ref to Medical Weight Management  Diabetes mellitus type 2 in obese [facility](HCC)  Coronary artery disease involving native heart without angina pectoris, unspecified vessel or lesion  type  Refer to wt management.  Counseled on diet and exercise. Patient should return 6 months for physical. The patient voiced understanding and agreement to the plan.   Jilda Rocheicholas Paul BirdsongWendling, DO 01/25/19  4:36 PM

## 2019-01-25 NOTE — Patient Instructions (Signed)
If you do not hear anything about your referral in the next 1-2 weeks, call our office and ask for an update.  Keep the diet clean and stay active.  Let us know if you need anything. 

## 2019-01-25 NOTE — Progress Notes (Signed)
Pre visit review using our clinic review tool, if applicable. No additional management support is needed unless otherwise documented below in the visit note. 

## 2019-02-09 DIAGNOSIS — M459 Ankylosing spondylitis of unspecified sites in spine: Secondary | ICD-10-CM | POA: Diagnosis not present

## 2019-02-09 DIAGNOSIS — M9903 Segmental and somatic dysfunction of lumbar region: Secondary | ICD-10-CM | POA: Diagnosis not present

## 2019-02-09 DIAGNOSIS — M9904 Segmental and somatic dysfunction of sacral region: Secondary | ICD-10-CM | POA: Diagnosis not present

## 2019-02-09 DIAGNOSIS — M4810 Ankylosing hyperostosis [Forestier], site unspecified: Secondary | ICD-10-CM | POA: Diagnosis not present

## 2019-02-24 DIAGNOSIS — I2510 Atherosclerotic heart disease of native coronary artery without angina pectoris: Secondary | ICD-10-CM | POA: Diagnosis not present

## 2019-02-24 DIAGNOSIS — I480 Paroxysmal atrial fibrillation: Secondary | ICD-10-CM | POA: Diagnosis not present

## 2019-02-24 DIAGNOSIS — E118 Type 2 diabetes mellitus with unspecified complications: Secondary | ICD-10-CM | POA: Diagnosis not present

## 2019-02-24 DIAGNOSIS — Z6841 Body Mass Index (BMI) 40.0 and over, adult: Secondary | ICD-10-CM | POA: Diagnosis not present

## 2019-03-07 DIAGNOSIS — M459 Ankylosing spondylitis of unspecified sites in spine: Secondary | ICD-10-CM | POA: Diagnosis not present

## 2019-03-07 DIAGNOSIS — M9903 Segmental and somatic dysfunction of lumbar region: Secondary | ICD-10-CM | POA: Diagnosis not present

## 2019-03-07 DIAGNOSIS — M4810 Ankylosing hyperostosis [Forestier], site unspecified: Secondary | ICD-10-CM | POA: Diagnosis not present

## 2019-03-07 DIAGNOSIS — M9904 Segmental and somatic dysfunction of sacral region: Secondary | ICD-10-CM | POA: Diagnosis not present

## 2019-03-21 DIAGNOSIS — M4810 Ankylosing hyperostosis [Forestier], site unspecified: Secondary | ICD-10-CM | POA: Diagnosis not present

## 2019-03-21 DIAGNOSIS — M9904 Segmental and somatic dysfunction of sacral region: Secondary | ICD-10-CM | POA: Diagnosis not present

## 2019-03-21 DIAGNOSIS — M9903 Segmental and somatic dysfunction of lumbar region: Secondary | ICD-10-CM | POA: Diagnosis not present

## 2019-03-21 DIAGNOSIS — M459 Ankylosing spondylitis of unspecified sites in spine: Secondary | ICD-10-CM | POA: Diagnosis not present

## 2019-04-20 DIAGNOSIS — M4810 Ankylosing hyperostosis [Forestier], site unspecified: Secondary | ICD-10-CM | POA: Diagnosis not present

## 2019-04-20 DIAGNOSIS — M9903 Segmental and somatic dysfunction of lumbar region: Secondary | ICD-10-CM | POA: Diagnosis not present

## 2019-04-20 DIAGNOSIS — M459 Ankylosing spondylitis of unspecified sites in spine: Secondary | ICD-10-CM | POA: Diagnosis not present

## 2019-04-20 DIAGNOSIS — M9904 Segmental and somatic dysfunction of sacral region: Secondary | ICD-10-CM | POA: Diagnosis not present

## 2019-05-01 DIAGNOSIS — G4733 Obstructive sleep apnea (adult) (pediatric): Secondary | ICD-10-CM | POA: Diagnosis not present

## 2019-05-15 DIAGNOSIS — M459 Ankylosing spondylitis of unspecified sites in spine: Secondary | ICD-10-CM | POA: Diagnosis not present

## 2019-05-15 DIAGNOSIS — M9903 Segmental and somatic dysfunction of lumbar region: Secondary | ICD-10-CM | POA: Diagnosis not present

## 2019-05-15 DIAGNOSIS — M4810 Ankylosing hyperostosis [Forestier], site unspecified: Secondary | ICD-10-CM | POA: Diagnosis not present

## 2019-05-15 DIAGNOSIS — M9904 Segmental and somatic dysfunction of sacral region: Secondary | ICD-10-CM | POA: Diagnosis not present

## 2019-05-18 DIAGNOSIS — M459 Ankylosing spondylitis of unspecified sites in spine: Secondary | ICD-10-CM | POA: Diagnosis not present

## 2019-05-18 DIAGNOSIS — M9904 Segmental and somatic dysfunction of sacral region: Secondary | ICD-10-CM | POA: Diagnosis not present

## 2019-05-18 DIAGNOSIS — M9903 Segmental and somatic dysfunction of lumbar region: Secondary | ICD-10-CM | POA: Diagnosis not present

## 2019-05-18 DIAGNOSIS — M4810 Ankylosing hyperostosis [Forestier], site unspecified: Secondary | ICD-10-CM | POA: Diagnosis not present

## 2019-06-02 DIAGNOSIS — M9903 Segmental and somatic dysfunction of lumbar region: Secondary | ICD-10-CM | POA: Diagnosis not present

## 2019-06-02 DIAGNOSIS — M4810 Ankylosing hyperostosis [Forestier], site unspecified: Secondary | ICD-10-CM | POA: Diagnosis not present

## 2019-06-02 DIAGNOSIS — M459 Ankylosing spondylitis of unspecified sites in spine: Secondary | ICD-10-CM | POA: Diagnosis not present

## 2019-06-02 DIAGNOSIS — M9904 Segmental and somatic dysfunction of sacral region: Secondary | ICD-10-CM | POA: Diagnosis not present

## 2019-07-05 ENCOUNTER — Ambulatory Visit (INDEPENDENT_AMBULATORY_CARE_PROVIDER_SITE_OTHER): Payer: BC Managed Care – PPO | Admitting: Family Medicine

## 2019-07-05 ENCOUNTER — Other Ambulatory Visit: Payer: Self-pay

## 2019-07-05 ENCOUNTER — Encounter: Payer: Self-pay | Admitting: Family Medicine

## 2019-07-05 VITALS — BP 120/78 | HR 81 | Temp 98.1°F | Ht 70.0 in | Wt 281.5 lb

## 2019-07-05 DIAGNOSIS — Z114 Encounter for screening for human immunodeficiency virus [HIV]: Secondary | ICD-10-CM

## 2019-07-05 DIAGNOSIS — Z0000 Encounter for general adult medical examination without abnormal findings: Secondary | ICD-10-CM | POA: Diagnosis not present

## 2019-07-05 DIAGNOSIS — Z125 Encounter for screening for malignant neoplasm of prostate: Secondary | ICD-10-CM | POA: Diagnosis not present

## 2019-07-05 DIAGNOSIS — E669 Obesity, unspecified: Secondary | ICD-10-CM

## 2019-07-05 DIAGNOSIS — E1169 Type 2 diabetes mellitus with other specified complication: Secondary | ICD-10-CM | POA: Diagnosis not present

## 2019-07-05 DIAGNOSIS — Z1159 Encounter for screening for other viral diseases: Secondary | ICD-10-CM

## 2019-07-05 DIAGNOSIS — Z23 Encounter for immunization: Secondary | ICD-10-CM

## 2019-07-05 LAB — COMPREHENSIVE METABOLIC PANEL
ALT: 27 U/L (ref 0–53)
AST: 20 U/L (ref 0–37)
Albumin: 4.9 g/dL (ref 3.5–5.2)
Alkaline Phosphatase: 48 U/L (ref 39–117)
BUN: 16 mg/dL (ref 6–23)
CO2: 29 mEq/L (ref 19–32)
Calcium: 10.4 mg/dL (ref 8.4–10.5)
Chloride: 101 mEq/L (ref 96–112)
Creatinine, Ser: 0.9 mg/dL (ref 0.40–1.50)
GFR: 85.7 mL/min (ref >60.00)
Glucose, Bld: 127 mg/dL — ABNORMAL HIGH (ref 70–99)
Potassium: 4 mEq/L (ref 3.5–5.1)
Sodium: 140 mEq/L (ref 135–145)
Total Bilirubin: 0.8 mg/dL (ref 0.2–1.2)
Total Protein: 7.5 g/dL (ref 6.0–8.3)

## 2019-07-05 LAB — MICROALBUMIN / CREATININE URINE RATIO
Creatinine,U: 68.3 mg/dL
Microalb Creat Ratio: 2.6 mg/g (ref 0.0–30.0)
Microalb, Ur: 1.8 mg/dL (ref 0.0–1.9)

## 2019-07-05 LAB — LIPID PANEL
Cholesterol: 101 mg/dL (ref 0–200)
HDL: 32 mg/dL — ABNORMAL LOW (ref >39.00)
LDL Cholesterol: 36 mg/dL (ref 0–99)
NonHDL: 69.47
Total CHOL/HDL Ratio: 3
Triglycerides: 167 mg/dL — ABNORMAL HIGH (ref 0.0–149.0)
VLDL: 33.4 mg/dL (ref 0.0–40.0)

## 2019-07-05 LAB — CBC
HCT: 47.2 % (ref 39.0–52.0)
Hemoglobin: 16 g/dL (ref 13.0–17.0)
MCHC: 33.9 g/dL (ref 30.0–36.0)
MCV: 87.4 fl (ref 78.0–100.0)
Platelets: 197 10*3/uL (ref 150.0–400.0)
RBC: 5.4 Mil/uL (ref 4.22–5.81)
RDW: 14.5 % (ref 11.5–15.5)
WBC: 5.4 10*3/uL (ref 4.0–10.5)

## 2019-07-05 LAB — PSA: PSA: 1.44 ng/mL (ref 0.10–4.00)

## 2019-07-05 LAB — HEMOGLOBIN A1C: Hgb A1c MFr Bld: 7.4 % — ABNORMAL HIGH (ref 4.6–6.5)

## 2019-07-05 MED ORDER — MECLIZINE HCL 25 MG PO TABS: 25.0000 mg | ORAL_TABLET | Freq: Three times a day (TID) | ORAL | 1 refills | Status: DC | PRN

## 2019-07-05 NOTE — Patient Instructions (Addendum)
Give [place] 2-3 business days to get the results of your labs back.   Keep the diet clean and stay active.  The new Shingrix vaccine (for shingles) is a 2 shot series. It can make people feel low energy, achy and almost like they have the flu for 48 hours after injection. Please plan accordingly when deciding on when to get this shot. Call our office for a nurse visit appointment to get this. The second shot of the series is less severe regarding the side effects, but it still lasts 48 hours.   Let me know if you need refills before your next appointment.  Let us know if you need anything.

## 2019-07-05 NOTE — Progress Notes (Signed)
Chief Complaint  Patient presents with  . Annual Exam    Well Male Luke RheinJohn W Sims is here for a complete physical.   His last physical was >1 year ago.  Current diet: in general, a "pretty good" diet.  Current exercise: walking Weight trend: lost some weight Seat belt? Yes.    Health maintenance Shingrix- No Colonoscopy- 6 years ago Tetanus- No HIV- No Hep C- No   Past Medical History:  Diagnosis Date  . Atrial flutter (HCC)   . Colon polyps   . Depression   . Diabetes mellitus without complication (HCC)   . Heart disease   . Hypertension   . Sleep apnea       Past Surgical History:  Procedure Laterality Date  . COLONOSCOPY      Medications  Current Outpatient Medications on File Prior to Visit  Medication Sig Dispense Refill  . amLODipine (NORVASC) 5 MG tablet TAKE 1 TABLET BY MOUTH DAILY    . apixaban (ELIQUIS) 5 MG TABS tablet Take 5 mg by mouth 2 (two) times daily.     Marland Kitchen. aspirin EC 81 MG tablet Take 81 mg by mouth daily.    . empagliflozin (JARDIANCE) 10 MG TABS tablet Take 10 mg by mouth daily.    Marland Kitchen. liraglutide (VICTOZA) 18 MG/3ML SOPN Inject 1.8 mg into the skin daily.    . meclizine (ANTIVERT) 25 MG tablet Take 25 mg by mouth 3 (three) times daily as needed for dizziness.    . metFORMIN (GLUMETZA) 1000 MG (MOD) 24 hr tablet Take 2 tablets twice daily    . rosuvastatin (CRESTOR) 20 MG tablet Take 20 mg by mouth daily.    . valsartan-hydrochlorothiazide (DIOVAN-HCT) 320-25 MG tablet Take by mouth.    . potassium chloride (K-DUR) 10 MEQ tablet 10 mEq. Takes for inflammation in ear prn     Allergies No Known Allergies  Family History Family History  Problem Relation Age of Onset  . Diabetes Mother   . Heart disease Father   . Hyperlipidemia Father   . Diabetes Sister   . COPD Brother   . Hypertension Son   . Arthritis Sister   . Arthritis Sister     Review of Systems: Constitutional:  no fevers Eye:  no recent significant change in  vision Ear/Nose/Mouth/Throat:  Ears: +R sided hearing loss (following with ENT) Nose/Mouth/Throat:  no complaints of nasal congestion, no sore throat Cardiovascular:  no chest pain, no palpitations Respiratory:  no cough and no shortness of breath Gastrointestinal:  no abdominal pain, no change in bowel habits GU:  Male: negative for dysuria, frequency, and incontinence and negative for prostate symptoms Musculoskeletal/Extremities:  no pain, redness, or swelling of the joints Integumentary (Skin/Breast):  no abnormal skin lesions reported Neurologic:  no headaches Endocrine: No unexpected weight changes Hematologic/Lymphatic:  no abnormal bleeding  Exam BP 120/78 (BP Location: Left Arm, Patient Position: Sitting, Cuff Size: Large)   Pulse 81   Temp 98.1 F (36.7 C) (Oral)   Ht 5\' 10"  (1.778 m)   Wt 281 lb 8 oz (127.7 kg)   SpO2 95%   BMI 40.39 kg/m  General:  well developed, well nourished, in no apparent distress Skin:  no significant moles, warts, or growths Head:  no masses, lesions, or tenderness Eyes:  pupils equal and round, sclera anicteric without injection Ears:  canals without lesions, TMs shiny without retraction, no obvious effusion, no erythema, +HOH Nose:  nares patent, septum midline, mucosa normal Throat/Pharynx:  lips  and gingiva without lesion; tongue and uvula midline; non-inflamed pharynx; no exudates or postnasal drainage Neck: neck supple without adenopathy, thyromegaly, or masses Lungs:  clear to auscultation, breath sounds equal bilaterally, no respiratory distress Cardio:  regular rate and rhythm, no LE edema, no bruits Abdomen:  abdomen soft, nontender; bowel sounds normal; no masses or organomegaly Rectal: Deferred Musculoskeletal:  symmetrical muscle groups noted without atrophy or deformity Extremities:  no clubbing, cyanosis, or edema, no deformities, no skin discoloration Neuro:  gait normal; deep tendon reflexes normal and symmetric Psych: well  oriented with normal range of affect and appropriate judgment/insight  Assessment and Plan  Well adult exam - Plan: CBC, Comprehensive metabolic panel, Lipid panel  Diabetes mellitus type 2 in obese ([facility]) - Plan: Hemoglobin A1c, Microalbumin / creatinine urine ratio, HM DIABETES FOOT EXAM; eye exam is next week.   Screening for HIV (human immunodeficiency virus) - Plan: HIV Antibody (routine testing w rflx)  Encounter for hepatitis C screening test for low risk patient - Plan: Hepatitis C antibody  Screening for malignant neoplasm of prostate - Plan: PSA   Well 63 y.o. male. Counseled on diet and exercise. Counseled on risks and benefits of prostate cancer screening with PSA. The patient agrees to undergo testing. Immunizations, labs, and further orders as above. Update PCV23 and Tdap. Info for Shingrix provided.  Follow up in 6 mo pending above. The patient voiced understanding and agreement to the plan.  [place], DO 07/05/19 9:26 AM

## 2019-07-05 NOTE — Addendum Note (Signed)
Addended by: Sharon Seller B on: 07/05/2019 09:48 AM   Modules accepted: Orders

## 2019-07-06 LAB — HEPATITIS C ANTIBODY
Hepatitis C Ab: NONREACTIVE
SIGNAL TO CUT-OFF: 0.01 (ref <1.00)

## 2019-07-06 LAB — HIV ANTIBODY (ROUTINE TESTING W REFLEX): HIV 1&2 Ab, 4th Generation: NONREACTIVE

## 2019-07-13 DIAGNOSIS — E119 Type 2 diabetes mellitus without complications: Secondary | ICD-10-CM | POA: Diagnosis not present

## 2019-07-13 DIAGNOSIS — H52223 Regular astigmatism, bilateral: Secondary | ICD-10-CM | POA: Diagnosis not present

## 2019-07-13 DIAGNOSIS — H2513 Age-related nuclear cataract, bilateral: Secondary | ICD-10-CM | POA: Diagnosis not present

## 2019-07-13 DIAGNOSIS — H524 Presbyopia: Secondary | ICD-10-CM | POA: Diagnosis not present

## 2019-07-13 DIAGNOSIS — Z7984 Long term (current) use of oral hypoglycemic drugs: Secondary | ICD-10-CM | POA: Diagnosis not present

## 2019-07-13 DIAGNOSIS — H43812 Vitreous degeneration, left eye: Secondary | ICD-10-CM | POA: Diagnosis not present

## 2019-07-13 DIAGNOSIS — H5213 Myopia, bilateral: Secondary | ICD-10-CM | POA: Diagnosis not present

## 2019-07-19 DIAGNOSIS — M459 Ankylosing spondylitis of unspecified sites in spine: Secondary | ICD-10-CM | POA: Diagnosis not present

## 2019-07-19 DIAGNOSIS — M4810 Ankylosing hyperostosis [Forestier], site unspecified: Secondary | ICD-10-CM | POA: Diagnosis not present

## 2019-07-19 DIAGNOSIS — M9903 Segmental and somatic dysfunction of lumbar region: Secondary | ICD-10-CM | POA: Diagnosis not present

## 2019-07-19 DIAGNOSIS — M9904 Segmental and somatic dysfunction of sacral region: Secondary | ICD-10-CM | POA: Diagnosis not present

## 2019-07-24 DIAGNOSIS — G4733 Obstructive sleep apnea (adult) (pediatric): Secondary | ICD-10-CM | POA: Diagnosis not present

## 2019-08-15 DIAGNOSIS — M9903 Segmental and somatic dysfunction of lumbar region: Secondary | ICD-10-CM | POA: Diagnosis not present

## 2019-08-15 DIAGNOSIS — M459 Ankylosing spondylitis of unspecified sites in spine: Secondary | ICD-10-CM | POA: Diagnosis not present

## 2019-08-15 DIAGNOSIS — M4810 Ankylosing hyperostosis [Forestier], site unspecified: Secondary | ICD-10-CM | POA: Diagnosis not present

## 2019-08-15 DIAGNOSIS — M9904 Segmental and somatic dysfunction of sacral region: Secondary | ICD-10-CM | POA: Diagnosis not present

## 2019-08-21 DIAGNOSIS — G4733 Obstructive sleep apnea (adult) (pediatric): Secondary | ICD-10-CM | POA: Diagnosis not present

## 2019-09-21 DIAGNOSIS — G4733 Obstructive sleep apnea (adult) (pediatric): Secondary | ICD-10-CM | POA: Diagnosis not present

## 2019-10-02 DIAGNOSIS — H9313 Tinnitus, bilateral: Secondary | ICD-10-CM | POA: Insufficient documentation

## 2019-10-02 DIAGNOSIS — IMO0001 Reserved for inherently not codable concepts without codable children: Secondary | ICD-10-CM | POA: Insufficient documentation

## 2019-10-02 DIAGNOSIS — H8101 Meniere's disease, right ear: Secondary | ICD-10-CM | POA: Insufficient documentation

## 2019-10-02 DIAGNOSIS — H903 Sensorineural hearing loss, bilateral: Secondary | ICD-10-CM | POA: Insufficient documentation

## 2019-10-05 DIAGNOSIS — Z79899 Other long term (current) drug therapy: Secondary | ICD-10-CM | POA: Diagnosis not present

## 2019-10-05 DIAGNOSIS — H918X1 Other specified hearing loss, right ear: Secondary | ICD-10-CM | POA: Diagnosis not present

## 2019-10-05 DIAGNOSIS — H9313 Tinnitus, bilateral: Secondary | ICD-10-CM | POA: Diagnosis not present

## 2019-10-05 DIAGNOSIS — H8101 Meniere's disease, right ear: Secondary | ICD-10-CM | POA: Diagnosis not present

## 2019-10-05 DIAGNOSIS — Z974 Presence of external hearing-aid: Secondary | ICD-10-CM | POA: Diagnosis not present

## 2019-10-05 DIAGNOSIS — H903 Sensorineural hearing loss, bilateral: Secondary | ICD-10-CM | POA: Diagnosis not present

## 2019-10-06 DIAGNOSIS — G4733 Obstructive sleep apnea (adult) (pediatric): Secondary | ICD-10-CM | POA: Diagnosis not present

## 2019-10-06 DIAGNOSIS — Z6841 Body Mass Index (BMI) 40.0 and over, adult: Secondary | ICD-10-CM | POA: Diagnosis not present

## 2019-10-10 DIAGNOSIS — M9903 Segmental and somatic dysfunction of lumbar region: Secondary | ICD-10-CM | POA: Diagnosis not present

## 2019-10-10 DIAGNOSIS — M9904 Segmental and somatic dysfunction of sacral region: Secondary | ICD-10-CM | POA: Diagnosis not present

## 2019-10-10 DIAGNOSIS — M459 Ankylosing spondylitis of unspecified sites in spine: Secondary | ICD-10-CM | POA: Diagnosis not present

## 2019-10-10 DIAGNOSIS — M4810 Ankylosing hyperostosis [Forestier], site unspecified: Secondary | ICD-10-CM | POA: Diagnosis not present

## 2019-10-21 DIAGNOSIS — G4733 Obstructive sleep apnea (adult) (pediatric): Secondary | ICD-10-CM | POA: Diagnosis not present

## 2019-10-25 DIAGNOSIS — R809 Proteinuria, unspecified: Secondary | ICD-10-CM | POA: Diagnosis not present

## 2019-10-25 DIAGNOSIS — E1129 Type 2 diabetes mellitus with other diabetic kidney complication: Secondary | ICD-10-CM | POA: Diagnosis not present

## 2019-10-30 ENCOUNTER — Telehealth: Payer: Self-pay | Admitting: Family Medicine

## 2019-10-30 DIAGNOSIS — Z794 Long term (current) use of insulin: Secondary | ICD-10-CM

## 2019-10-30 DIAGNOSIS — E1129 Type 2 diabetes mellitus with other diabetic kidney complication: Secondary | ICD-10-CM

## 2019-10-30 DIAGNOSIS — R809 Proteinuria, unspecified: Secondary | ICD-10-CM

## 2019-10-30 DIAGNOSIS — E041 Nontoxic single thyroid nodule: Secondary | ICD-10-CM

## 2019-10-30 NOTE — Telephone Encounter (Signed)
Copied from [place] [id number]. Topic: General - Inquiry >> Oct 27, 2019  5:06 PM Alease Frame wrote: Reason for CRM: Patient is returning call from Dr Serita Sheller office . Please advsie   Called left message to call back. (we received lab orders from [facility] and am calling patient to order labs//make lab appt)

## 2019-10-31 NOTE — Telephone Encounter (Signed)
Labs ordered//appt made. ?

## 2019-11-06 ENCOUNTER — Other Ambulatory Visit (INDEPENDENT_AMBULATORY_CARE_PROVIDER_SITE_OTHER): Payer: BC Managed Care – PPO

## 2019-11-06 ENCOUNTER — Other Ambulatory Visit: Payer: Self-pay

## 2019-11-06 DIAGNOSIS — R809 Proteinuria, unspecified: Secondary | ICD-10-CM

## 2019-11-06 DIAGNOSIS — E1129 Type 2 diabetes mellitus with other diabetic kidney complication: Secondary | ICD-10-CM | POA: Diagnosis not present

## 2019-11-06 DIAGNOSIS — E041 Nontoxic single thyroid nodule: Secondary | ICD-10-CM | POA: Diagnosis not present

## 2019-11-06 DIAGNOSIS — Z794 Long term (current) use of insulin: Secondary | ICD-10-CM

## 2019-11-07 LAB — COMPREHENSIVE METABOLIC PANEL
ALT: 19 U/L (ref 0–53)
AST: 17 U/L (ref 0–37)
Albumin: 4.7 g/dL (ref 3.5–5.2)
Alkaline Phosphatase: 47 U/L (ref 39–117)
BUN: 15 mg/dL (ref 6–23)
CO2: 33 mEq/L — ABNORMAL HIGH (ref 19–32)
Calcium: 10.5 mg/dL (ref 8.4–10.5)
Chloride: 101 mEq/L (ref 96–112)
Creatinine, Ser: 0.89 mg/dL (ref 0.40–1.50)
GFR: 86.71 mL/min (ref >60.00)
Glucose, Bld: 79 mg/dL (ref 70–99)
Potassium: 3.7 mEq/L (ref 3.5–5.1)
Sodium: 141 mEq/L (ref 135–145)
Total Bilirubin: 0.7 mg/dL (ref 0.2–1.2)
Total Protein: 7.1 g/dL (ref 6.0–8.3)

## 2019-11-07 LAB — HEMOGLOBIN A1C: Hgb A1c MFr Bld: 7.3 % — ABNORMAL HIGH (ref 4.6–6.5)

## 2019-11-07 LAB — T4, FREE: Free T4: 0.84 ng/dL (ref 0.60–1.60)

## 2019-11-07 LAB — TSH: TSH: 2.83 u[IU]/mL (ref 0.35–4.50)

## 2019-11-09 DIAGNOSIS — Z20828 Contact with and (suspected) exposure to other viral communicable diseases: Secondary | ICD-10-CM | POA: Diagnosis not present

## 2019-11-21 DIAGNOSIS — G4733 Obstructive sleep apnea (adult) (pediatric): Secondary | ICD-10-CM | POA: Diagnosis not present

## 2019-12-01 DIAGNOSIS — M4810 Ankylosing hyperostosis [Forestier], site unspecified: Secondary | ICD-10-CM | POA: Diagnosis not present

## 2019-12-01 DIAGNOSIS — M459 Ankylosing spondylitis of unspecified sites in spine: Secondary | ICD-10-CM | POA: Diagnosis not present

## 2019-12-01 DIAGNOSIS — M9903 Segmental and somatic dysfunction of lumbar region: Secondary | ICD-10-CM | POA: Diagnosis not present

## 2019-12-01 DIAGNOSIS — M9904 Segmental and somatic dysfunction of sacral region: Secondary | ICD-10-CM | POA: Diagnosis not present

## 2019-12-15 DIAGNOSIS — M9903 Segmental and somatic dysfunction of lumbar region: Secondary | ICD-10-CM | POA: Diagnosis not present

## 2019-12-15 DIAGNOSIS — M4810 Ankylosing hyperostosis [Forestier], site unspecified: Secondary | ICD-10-CM | POA: Diagnosis not present

## 2019-12-15 DIAGNOSIS — M9904 Segmental and somatic dysfunction of sacral region: Secondary | ICD-10-CM | POA: Diagnosis not present

## 2019-12-15 DIAGNOSIS — M459 Ankylosing spondylitis of unspecified sites in spine: Secondary | ICD-10-CM | POA: Diagnosis not present

## 2020-01-05 ENCOUNTER — Ambulatory Visit: Payer: BC Managed Care – PPO | Admitting: Family Medicine

## 2020-01-05 ENCOUNTER — Encounter: Payer: Self-pay | Admitting: Family Medicine

## 2020-01-05 ENCOUNTER — Other Ambulatory Visit: Payer: Self-pay

## 2020-01-05 VITALS — BP 120/72 | HR 61 | Temp 97.5°F | Ht 70.0 in | Wt 278.0 lb

## 2020-01-05 DIAGNOSIS — M25561 Pain in right knee: Secondary | ICD-10-CM | POA: Diagnosis not present

## 2020-01-05 DIAGNOSIS — M25562 Pain in left knee: Secondary | ICD-10-CM | POA: Diagnosis not present

## 2020-01-05 NOTE — Progress Notes (Signed)
Chief Complaint  Patient presents with  . Follow-up    Subjective Luke Sims is a 64 y.o. male who presents for f/u.  B/l knee pain over past few weeks. Has been more sedentary lately. No inj or change in activity. Denies swelling, bruising, redness, catching/locking. Has not tried anything at home so far.    Past Medical History:  Diagnosis Date  . Atrial flutter (HCC)   . Colon polyps   . Depression   . Diabetes mellitus without complication (HCC)   . Heart disease   . Hypertension   . Sleep apnea     Review of Systems MSK: +knee pain  Exam BP 120/72 (BP Location: Left Arm, Patient Position: Sitting, Cuff Size: Large)   Pulse 61   Temp (!) 97.5 F (36.4 C) (Temporal)   Ht 5\' 10"  (1.778 m)   Wt 278 lb (126.1 kg)   SpO2 95%   BMI 39.89 kg/m  General:  well developed, well nourished, in no apparent distress Heart: RRR Lungs: clear to auscultation, no accessory muscle use MSK: No ttp, no effusion, neg Stine's b/l Neuro: Gait nml, 5/5 strength throughout in LE's Psych: well oriented with normal range of affect and appropriate judgment/insight  Pain in both knees, unspecified chronicity  Ice, heat, Tylenol, stretches/exercises.  Counseled on diet and exercise. F/u in 6 mo for CPE and will see yearly afterwards. The patient voiced understanding and agreement to the plan.  [place], DO 01/05/20  11:54 AM

## 2020-01-05 NOTE — Patient Instructions (Signed)
Keep the diet clean and stay active.  Aim to do some physical exertion for 150 minutes per week. This is typically divided into 5 days per week, 30 minutes per day. The activity should be enough to get your heart rate up. Anything is better than nothing if you have time constraints.  Let us know if you need anything.  Knee Exercises It is normal to feel mild stretching, pulling, tightness, or discomfort as you do these exercises, but you should stop right away if you feel sudden pain or your pain gets worse. STRETCHING AND RANGE OF MOTION EXERCISES  These exercises warm up your muscles and joints and improve the movement and flexibility of your knee. These exercises also help to relieve pain, numbness, and tingling. Exercise A: Knee Extension, Prone  1. Lie on your abdomen on a bed. 2. Place your left / right knee just beyond the edge of the surface so your knee is not on the bed. You can put a towel under your left / right thigh just above your knee for comfort. 3. Relax your leg muscles and allow gravity to straighten your knee. You should feel a stretch behind your left / right knee. 4. Hold this position for 30 seconds. 5. Scoot up so your knee is supported between repetitions. Repeat 2 times. Complete this stretch 3 times per week. Exercise B: Knee Flexion, Active    1. Lie on your back with both knees straight. If this causes back discomfort, bend your left / right knee so your foot is flat on the floor. 2. Slowly slide your left / right heel back toward your buttocks until you feel a gentle stretch in the front of your knee or thigh. 3. Hold this position for 30 seconds. 4. Slowly slide your left / right heel back to the starting position. Repeat 2 times. Complete this exercise 3 times per week. Exercise C: Quadriceps, Prone    1. Lie on your abdomen on a firm surface, such as a bed or padded floor. 2. Bend your left / right knee and hold your ankle. If you cannot reach your  ankle or pant leg, loop a belt around your foot and grab the belt instead. 3. Gently pull your heel toward your buttocks. Your knee should not slide out to the side. You should feel a stretch in the front of your thigh and knee. 4. Hold this position for 30 seconds. Repeat 2 times. Complete this stretch 3 times per week. Exercise D: Hamstring, Supine  1. Lie on your back. 2. Loop a belt or towel over the ball of your left / right foot. The ball of your foot is on the walking surface, right under your toes. 3. Straighten your left / right knee and slowly pull on the belt to raise your leg until you feel a gentle stretch behind your knee. ? Do not let your left / right knee bend while you do this. ? Keep your other leg flat on the floor. 4. Hold this position for 30 seconds. Repeat 2 times. Complete this stretch 3 times per week. STRENGTHENING EXERCISES  These exercises build strength and endurance in your knee. Endurance is the ability to use your muscles for a long time, even after they get tired. Exercise E: Quadriceps, Isometric    1. Lie on your back with your left / right leg extended and your other knee bent. Put a rolled towel or small pillow under your knee if told by your health  care provider. 2. Slowly tense the muscles in the front of your left / right thigh. You should see your kneecap slide up toward your hip or see increased dimpling just above the knee. This motion will push the back of the knee toward the floor. 3. For 3 seconds, keep the muscle as tight as you can without increasing your pain. 4. Relax the muscles slowly and completely. Repeat for 10 total reps Repeat 2 ti mes. Complete this exercise 3 times per week. Exercise F: Straight Leg Raises - Quadriceps  1. Lie on your back with your left / right leg extended and your other knee bent. 2. Tense the muscles in the front of your left / right thigh. You should see your kneecap slide up or see increased dimpling just  above the knee. Your thigh may even shake a bit. 3. Keep these muscles tight as you raise your leg 4-6 inches (10-15 cm) off the floor. Do not let your knee bend. 4. Hold this position for 3 seconds. 5. Keep these muscles tense as you lower your leg. 6. Relax your muscles slowly and completely after each repetition. 10 total reps. Repeat 2 times. Complete this exercise 3 times per week.  Exercise G: Hamstring Curls    If told by your health care provider, do this exercise while wearing ankle weights. Begin with 5 lb weights (optional). Then increase the weight by 1 lb (0.5 kg) increments. Do not wear ankle weights that are more than 20 lbs to start with. 1. Lie on your abdomen with your legs straight. 2. Bend your left / right knee as far as you can without feeling pain. Keep your hips flat against the floor. 3. Hold this position for 3 seconds. 4. Slowly lower your leg to the starting position. Repeat for 10 reps.  Repeat 2 times. Complete this exercise 3 times per week. Exercise H: Squats (Quadriceps)  1. Stand in front of a table, with your feet and knees pointing straight ahead. You may rest your hands on the table for balance but not for support. 2. Slowly bend your knees and lower your hips like you are going to sit in a chair. ? Keep your weight over your heels, not over your toes. ? Keep your lower legs upright so they are parallel with the table legs. ? Do not let your hips go lower than your knees. ? Do not bend lower than told by your health care provider. ? If your knee pain increases, do not bend as low. 3. Hold the squat position for 1 second. 4. Slowly push with your legs to return to standing. Do not use your hands to pull yourself to standing. Repeat 2 times. Complete this exercise 3 times per week. Exercise I: Wall Slides (Quadriceps)    1. Lean your back against a smooth wall or door while you walk your feet out 18-24 inches (46-61 cm) from it. 2. Place your feet  hip-width apart. 3. Slowly slide down the wall or door until your knees Repeat 2 times. Complete this exercise every other day. 4. Exercise K: Straight Leg Raises - Hip Abductors  1. Lie on your side with your left / right leg in the top position. Lie so your head, shoulder, knee, and hip line up. You may bend your bottom knee to help you keep your balance. 2. Roll your hips slightly forward so your hips are stacked directly over each other and your left / right knee is facing forward.  3. Leading with your heel, lift your top leg 4-6 inches (10-15 cm). You should feel the muscles in your outer hip lifting. ? Do not let your foot drift forward. ? Do not let your knee roll toward the ceiling. 4. Hold this position for 3 seconds. 5. Slowly return your leg to the starting position. 6. Let your muscles relax completely after each repetition. 10 total reps. Repeat 2 times. Complete this exercise 3 times per week. Exercise J: Straight Leg Raises - Hip Extensors  1. Lie on your abdomen on a firm surface. You can put a pillow under your hips if that is more comfortable. 2. Tense the muscles in your buttocks and lift your left / right leg about 4-6 inches (10-15 cm). Keep your knee straight as you lift your leg. 3. Hold this position for 3 seconds. 4. Slowly lower your leg to the starting position. 5. Let your leg relax completely after each repetition. Repeat 2 times. Complete this exercise 3 times per week. Document Released: 10/28/2005 Document Revised: 09/07/2016 Document Reviewed: 10/20/2015 Elsevier Interactive Patient Education  2017 [employer].

## 2020-02-05 DIAGNOSIS — M459 Ankylosing spondylitis of unspecified sites in spine: Secondary | ICD-10-CM | POA: Diagnosis not present

## 2020-02-05 DIAGNOSIS — M4810 Ankylosing hyperostosis [Forestier], site unspecified: Secondary | ICD-10-CM | POA: Diagnosis not present

## 2020-02-05 DIAGNOSIS — M9903 Segmental and somatic dysfunction of lumbar region: Secondary | ICD-10-CM | POA: Diagnosis not present

## 2020-02-05 DIAGNOSIS — M9904 Segmental and somatic dysfunction of sacral region: Secondary | ICD-10-CM | POA: Diagnosis not present

## 2020-02-27 DIAGNOSIS — M9904 Segmental and somatic dysfunction of sacral region: Secondary | ICD-10-CM | POA: Diagnosis not present

## 2020-02-27 DIAGNOSIS — M459 Ankylosing spondylitis of unspecified sites in spine: Secondary | ICD-10-CM | POA: Diagnosis not present

## 2020-02-27 DIAGNOSIS — M9903 Segmental and somatic dysfunction of lumbar region: Secondary | ICD-10-CM | POA: Diagnosis not present

## 2020-02-27 DIAGNOSIS — M4810 Ankylosing hyperostosis [Forestier], site unspecified: Secondary | ICD-10-CM | POA: Diagnosis not present

## 2020-03-29 DIAGNOSIS — H918X1 Other specified hearing loss, right ear: Secondary | ICD-10-CM | POA: Diagnosis not present

## 2020-03-29 DIAGNOSIS — J302 Other seasonal allergic rhinitis: Secondary | ICD-10-CM | POA: Diagnosis not present

## 2020-03-29 DIAGNOSIS — H8101 Meniere's disease, right ear: Secondary | ICD-10-CM | POA: Diagnosis not present

## 2020-03-29 DIAGNOSIS — Z87891 Personal history of nicotine dependence: Secondary | ICD-10-CM | POA: Diagnosis not present

## 2020-03-29 DIAGNOSIS — H9313 Tinnitus, bilateral: Secondary | ICD-10-CM | POA: Diagnosis not present

## 2020-03-29 DIAGNOSIS — Z9621 Cochlear implant status: Secondary | ICD-10-CM | POA: Diagnosis not present

## 2020-03-29 DIAGNOSIS — H903 Sensorineural hearing loss, bilateral: Secondary | ICD-10-CM | POA: Diagnosis not present

## 2020-04-08 DIAGNOSIS — M4810 Ankylosing hyperostosis [Forestier], site unspecified: Secondary | ICD-10-CM | POA: Diagnosis not present

## 2020-04-08 DIAGNOSIS — M459 Ankylosing spondylitis of unspecified sites in spine: Secondary | ICD-10-CM | POA: Diagnosis not present

## 2020-04-08 DIAGNOSIS — M9903 Segmental and somatic dysfunction of lumbar region: Secondary | ICD-10-CM | POA: Diagnosis not present

## 2020-04-08 DIAGNOSIS — M9904 Segmental and somatic dysfunction of sacral region: Secondary | ICD-10-CM | POA: Diagnosis not present

## 2020-04-18 DIAGNOSIS — M9903 Segmental and somatic dysfunction of lumbar region: Secondary | ICD-10-CM | POA: Diagnosis not present

## 2020-04-18 DIAGNOSIS — M9904 Segmental and somatic dysfunction of sacral region: Secondary | ICD-10-CM | POA: Diagnosis not present

## 2020-04-18 DIAGNOSIS — M4810 Ankylosing hyperostosis [Forestier], site unspecified: Secondary | ICD-10-CM | POA: Diagnosis not present

## 2020-04-18 DIAGNOSIS — M459 Ankylosing spondylitis of unspecified sites in spine: Secondary | ICD-10-CM | POA: Diagnosis not present

## 2020-05-07 DIAGNOSIS — M9903 Segmental and somatic dysfunction of lumbar region: Secondary | ICD-10-CM | POA: Diagnosis not present

## 2020-05-07 DIAGNOSIS — M4810 Ankylosing hyperostosis [Forestier], site unspecified: Secondary | ICD-10-CM | POA: Diagnosis not present

## 2020-05-07 DIAGNOSIS — M459 Ankylosing spondylitis of unspecified sites in spine: Secondary | ICD-10-CM | POA: Diagnosis not present

## 2020-05-07 DIAGNOSIS — M9904 Segmental and somatic dysfunction of sacral region: Secondary | ICD-10-CM | POA: Diagnosis not present

## 2020-05-16 DIAGNOSIS — M9903 Segmental and somatic dysfunction of lumbar region: Secondary | ICD-10-CM | POA: Diagnosis not present

## 2020-05-16 DIAGNOSIS — M9904 Segmental and somatic dysfunction of sacral region: Secondary | ICD-10-CM | POA: Diagnosis not present

## 2020-05-16 DIAGNOSIS — M459 Ankylosing spondylitis of unspecified sites in spine: Secondary | ICD-10-CM | POA: Diagnosis not present

## 2020-05-16 DIAGNOSIS — M4810 Ankylosing hyperostosis [Forestier], site unspecified: Secondary | ICD-10-CM | POA: Diagnosis not present

## 2020-06-04 DIAGNOSIS — M459 Ankylosing spondylitis of unspecified sites in spine: Secondary | ICD-10-CM | POA: Diagnosis not present

## 2020-06-04 DIAGNOSIS — M4810 Ankylosing hyperostosis [Forestier], site unspecified: Secondary | ICD-10-CM | POA: Diagnosis not present

## 2020-06-04 DIAGNOSIS — M9904 Segmental and somatic dysfunction of sacral region: Secondary | ICD-10-CM | POA: Diagnosis not present

## 2020-06-04 DIAGNOSIS — M9903 Segmental and somatic dysfunction of lumbar region: Secondary | ICD-10-CM | POA: Diagnosis not present

## 2020-06-17 DIAGNOSIS — M4810 Ankylosing hyperostosis [Forestier], site unspecified: Secondary | ICD-10-CM | POA: Diagnosis not present

## 2020-06-17 DIAGNOSIS — M9903 Segmental and somatic dysfunction of lumbar region: Secondary | ICD-10-CM | POA: Diagnosis not present

## 2020-06-17 DIAGNOSIS — M9904 Segmental and somatic dysfunction of sacral region: Secondary | ICD-10-CM | POA: Diagnosis not present

## 2020-06-17 DIAGNOSIS — M459 Ankylosing spondylitis of unspecified sites in spine: Secondary | ICD-10-CM | POA: Diagnosis not present

## 2020-06-18 DIAGNOSIS — D485 Neoplasm of uncertain behavior of skin: Secondary | ICD-10-CM | POA: Diagnosis not present

## 2020-06-18 DIAGNOSIS — D1801 Hemangioma of skin and subcutaneous tissue: Secondary | ICD-10-CM | POA: Diagnosis not present

## 2020-06-18 DIAGNOSIS — L918 Other hypertrophic disorders of the skin: Secondary | ICD-10-CM | POA: Diagnosis not present

## 2020-07-05 ENCOUNTER — Other Ambulatory Visit: Payer: Self-pay

## 2020-07-05 ENCOUNTER — Ambulatory Visit (INDEPENDENT_AMBULATORY_CARE_PROVIDER_SITE_OTHER): Payer: BC Managed Care – PPO | Admitting: Family Medicine

## 2020-07-05 ENCOUNTER — Encounter: Payer: Self-pay | Admitting: Family Medicine

## 2020-07-05 VITALS — BP 124/80 | HR 60 | Temp 97.9°F | Ht 70.0 in | Wt 276.5 lb

## 2020-07-05 DIAGNOSIS — E669 Obesity, unspecified: Secondary | ICD-10-CM | POA: Diagnosis not present

## 2020-07-05 DIAGNOSIS — N401 Enlarged prostate with lower urinary tract symptoms: Secondary | ICD-10-CM

## 2020-07-05 DIAGNOSIS — Z125 Encounter for screening for malignant neoplasm of prostate: Secondary | ICD-10-CM | POA: Diagnosis not present

## 2020-07-05 DIAGNOSIS — Z0000 Encounter for general adult medical examination without abnormal findings: Secondary | ICD-10-CM | POA: Diagnosis not present

## 2020-07-05 DIAGNOSIS — R3912 Poor urinary stream: Secondary | ICD-10-CM

## 2020-07-05 DIAGNOSIS — E1169 Type 2 diabetes mellitus with other specified complication: Secondary | ICD-10-CM | POA: Diagnosis not present

## 2020-07-05 LAB — LIPID PANEL
Cholesterol: 85 mg/dL (ref 0–200)
HDL: 32.9 mg/dL — ABNORMAL LOW (ref >39.00)
LDL Cholesterol: 29 mg/dL (ref 0–99)
NonHDL: 52.22
Total CHOL/HDL Ratio: 3
Triglycerides: 117 mg/dL (ref 0.0–149.0)
VLDL: 23.4 mg/dL (ref 0.0–40.0)

## 2020-07-05 LAB — CBC
HCT: 45.1 % (ref 39.0–52.0)
Hemoglobin: 15.4 g/dL (ref 13.0–17.0)
MCHC: 34.2 g/dL (ref 30.0–36.0)
MCV: 87 fl (ref 78.0–100.0)
Platelets: 194 10*3/uL (ref 150.0–400.0)
RBC: 5.18 Mil/uL (ref 4.22–5.81)
RDW: 13.9 % (ref 11.5–15.5)
WBC: 5.5 10*3/uL (ref 4.0–10.5)

## 2020-07-05 LAB — MICROALBUMIN / CREATININE URINE RATIO
Creatinine,U: 70 mg/dL
Microalb Creat Ratio: 3.4 mg/g (ref 0.0–30.0)
Microalb, Ur: 2.4 mg/dL — ABNORMAL HIGH (ref 0.0–1.9)

## 2020-07-05 LAB — COMPREHENSIVE METABOLIC PANEL
ALT: 24 U/L (ref 0–53)
AST: 20 U/L (ref 0–37)
Albumin: 4.7 g/dL (ref 3.5–5.2)
Alkaline Phosphatase: 44 U/L (ref 39–117)
BUN: 13 mg/dL (ref 6–23)
CO2: 31 mEq/L (ref 19–32)
Calcium: 10.1 mg/dL (ref 8.4–10.5)
Chloride: 102 mEq/L (ref 96–112)
Creatinine, Ser: 0.85 mg/dL (ref 0.40–1.50)
GFR: 91.24 mL/min (ref >60.00)
Glucose, Bld: 108 mg/dL — ABNORMAL HIGH (ref 70–99)
Potassium: 4 mEq/L (ref 3.5–5.1)
Sodium: 141 mEq/L (ref 135–145)
Total Bilirubin: 0.6 mg/dL (ref 0.2–1.2)
Total Protein: 7 g/dL (ref 6.0–8.3)

## 2020-07-05 LAB — PSA: PSA: 1.36 ng/mL (ref 0.10–4.00)

## 2020-07-05 LAB — HEMOGLOBIN A1C: Hgb A1c MFr Bld: 7.3 % — ABNORMAL HIGH (ref 4.6–6.5)

## 2020-07-05 MED ORDER — TAMSULOSIN HCL 0.4 MG PO CAPS: 0.4000 mg | ORAL_CAPSULE | Freq: Every day | ORAL | 0 refills | Status: DC

## 2020-07-05 NOTE — Patient Instructions (Addendum)
Give Luke Sims 2-3 business days to get the results of your labs back.   Consider yoga and more stretching.   Keep the diet clean and stay active.  If you do not hear anything about your referral in the next 1-2 weeks, call our office and ask for an update.  The new Shingrix vaccine (for shingles) is a 2 shot series. It can make people feel low energy, achy and almost like they have the flu for 48 hours after injection. Please plan accordingly when deciding on when to get this shot. Call our office for a nurse visit appointment to get this. The second shot of the series is less severe regarding the side effects, but it still lasts 48 hours.    Let Luke Sims know if you need anything.

## 2020-07-05 NOTE — Progress Notes (Signed)
Chief Complaint  Patient presents with  . Annual Exam    Well Male Luke Sims is here for a complete physical.   His last physical was >1 year ago.  Current diet: in general, a "healthy" diet.  Current exercise: walking Weight trend: stable Fatigue? No.  Seat belt? Yes.    Health maintenance Shingrix- No Colonoscopy- Yes Tetanus- Yes HIV- Yes Hep C- Yes   Past Medical History:  Diagnosis Date  . Atrial flutter (HCC)   . Colon polyps   . Depression   . Diabetes mellitus without complication (HCC)   . Heart disease   . Hypertension   . Sleep apnea       Past Surgical History:  Procedure Laterality Date  . COLONOSCOPY      Medications  Current Outpatient Medications on File Prior to Visit  Medication Sig Dispense Refill  . amLODipine (NORVASC) 5 MG tablet TAKE 1 TABLET BY MOUTH DAILY    . apixaban (ELIQUIS) 5 MG TABS tablet Take 5 mg by mouth 2 (two) times daily.     Marland Kitchen aspirin EC 81 MG tablet Take 81 mg by mouth daily.    . empagliflozin (JARDIANCE) 10 MG TABS tablet Take 10 mg by mouth daily.    Marland Kitchen liraglutide (VICTOZA) 18 MG/3ML SOPN Inject 1.8 mg into the skin daily.    . metFORMIN (GLUMETZA) 1000 MG (MOD) 24 hr tablet Take 2 tablets twice daily    . potassium chloride (K-DUR) 10 MEQ tablet 10 mEq. Takes for inflammation in ear prn    . rosuvastatin (CRESTOR) 20 MG tablet Take 20 mg by mouth daily.    . valsartan-hydrochlorothiazide (DIOVAN-HCT) 320-25 MG tablet Take by mouth.      Allergies No Known Allergies  Family History Family History  Problem Relation Age of Onset  . Diabetes Mother   . Heart disease Father   . Hyperlipidemia Father   . Diabetes Sister   . COPD Brother   . Hypertension Son   . Arthritis Sister   . Arthritis Sister     Review of Systems: Constitutional:  no fevers Eye:  no recent significant change in vision Ear/Nose/Mouth/Throat:  Ears:  +tinnitus Nose/Mouth/Throat:  no complaints of nasal congestion, no sore  throat Cardiovascular:  no chest pain, no palpitations Respiratory:  no cough and no shortness of breath Gastrointestinal:  no abdominal pain, no change in bowel habits  GU:  +weak stream, no pain or bleeding Musculoskeletal/Extremities:  no new pain, redness, or swelling of the joints Integumentary (Skin/Breast):  no abnormal skin lesions reported Neurologic:  no headaches Endocrine: No unexpected weight changes Hematologic/Lymphatic:  no abnormal bleeding  Exam BP 124/80 (BP Location: Left Arm, Patient Position: Sitting, Cuff Size: Normal)   Pulse 60   Temp 97.9 F (36.6 C) (Oral)   Ht 5\' 10"  (1.778 m)   Wt 276 lb 8 oz (125.4 kg)   SpO2 95%   BMI 39.67 kg/m  General:  well developed, well nourished, in no apparent distress Skin:  no significant moles, warts, or growths Head:  no masses, lesions, or tenderness Eyes:  pupils equal and round, sclera anicteric without injection Ears:  canals without lesions, TMs shiny without retraction, no obvious effusion, no erythema Nose:  nares patent, septum midline, mucosa normal Throat/Pharynx:  lips and gingiva without lesion; tongue and uvula midline; non-inflamed pharynx; no exudates or postnasal drainage Neck: neck supple without adenopathy, thyromegaly, or masses Cardiac: RRR, no bruits, no LE edema Lungs:  clear  to auscultation, breath sounds equal bilaterally, no respiratory distress Rectal: Deferred Musculoskeletal:  symmetrical muscle groups noted without atrophy or deformity Neuro:  gait normal; deep tendon reflexes normal and symmetric Psych: well oriented with normal range of affect and appropriate judgment/insight  Assessment and Plan  Well adult exam - Plan: CBC, Comprehensive metabolic panel, Lipid panel  Screening for malignant neoplasm of prostate - Plan: PSA  Diabetes mellitus type 2 in obese (HCC) - Plan: Hemoglobin A1c, Ambulatory referral to Endocrinology, Microalbumin / creatinine urine ratio  Benign prostatic  hyperplasia with weak urinary stream - Plan: tamsulosin (FLOMAX) 0.4 MG CAPS capsule   Well 64 y.o. male. Counseled on diet and exercise. Counseled on risks and benefits of prostate cancer screening with PSA. The patient agrees to undergo testing. Requesting referral to local endocrinologist.  Immunizations, labs, and further orders as above. Follow up in 1 mo to see how Flomax is woring for stream. The patient voiced understanding and agreement to the plan.  [name] [place], DO 07/05/20 9:18 AM

## 2020-07-17 DIAGNOSIS — H2513 Age-related nuclear cataract, bilateral: Secondary | ICD-10-CM | POA: Diagnosis not present

## 2020-07-17 DIAGNOSIS — H43812 Vitreous degeneration, left eye: Secondary | ICD-10-CM | POA: Diagnosis not present

## 2020-07-17 DIAGNOSIS — Z7984 Long term (current) use of oral hypoglycemic drugs: Secondary | ICD-10-CM | POA: Diagnosis not present

## 2020-07-17 DIAGNOSIS — E119 Type 2 diabetes mellitus without complications: Secondary | ICD-10-CM | POA: Diagnosis not present

## 2020-07-23 DIAGNOSIS — E669 Obesity, unspecified: Secondary | ICD-10-CM | POA: Diagnosis not present

## 2020-07-23 DIAGNOSIS — G4733 Obstructive sleep apnea (adult) (pediatric): Secondary | ICD-10-CM | POA: Diagnosis not present

## 2020-07-24 DIAGNOSIS — K148 Other diseases of tongue: Secondary | ICD-10-CM | POA: Diagnosis not present

## 2020-08-02 DIAGNOSIS — I480 Paroxysmal atrial fibrillation: Secondary | ICD-10-CM | POA: Diagnosis not present

## 2020-08-02 DIAGNOSIS — I10 Essential (primary) hypertension: Secondary | ICD-10-CM | POA: Diagnosis not present

## 2020-08-02 DIAGNOSIS — I2510 Atherosclerotic heart disease of native coronary artery without angina pectoris: Secondary | ICD-10-CM | POA: Diagnosis not present

## 2020-08-06 ENCOUNTER — Ambulatory Visit: Payer: BC Managed Care – PPO | Admitting: Family Medicine

## 2020-08-06 ENCOUNTER — Encounter: Payer: Self-pay | Admitting: Family Medicine

## 2020-08-06 ENCOUNTER — Other Ambulatory Visit: Payer: Self-pay

## 2020-08-06 VITALS — BP 120/64 | HR 64 | Temp 98.7°F | Ht 70.0 in | Wt 276.1 lb

## 2020-08-06 DIAGNOSIS — E669 Obesity, unspecified: Secondary | ICD-10-CM

## 2020-08-06 DIAGNOSIS — E1169 Type 2 diabetes mellitus with other specified complication: Secondary | ICD-10-CM | POA: Diagnosis not present

## 2020-08-06 DIAGNOSIS — R3912 Poor urinary stream: Secondary | ICD-10-CM | POA: Diagnosis not present

## 2020-08-06 DIAGNOSIS — N401 Enlarged prostate with lower urinary tract symptoms: Secondary | ICD-10-CM | POA: Diagnosis not present

## 2020-08-06 MED ORDER — TAMSULOSIN HCL 0.4 MG PO CAPS: 0.4000 mg | ORAL_CAPSULE | Freq: Every day | ORAL | 2 refills | Status: DC

## 2020-08-06 NOTE — Patient Instructions (Addendum)
If you do not hear anything about your referral in the next 1-2 weeks, call our office and ask for an update.  Let me know if you start having issue with the Flomax, let me know.  Let us know if you need anything.

## 2020-08-06 NOTE — Progress Notes (Signed)
Chief Complaint  Patient presents with  . Follow-up    Subjective: Patient is a 64 y.o. male here for f/u bph.  Started on Flomax 0.4 mg/d. Reports compliance, a few seconds of difficulty focusing with his eyes. Symptoms are significantly better. Does not want to change.   For DM, interested in seeing endo in our office.   Past Medical History:  Diagnosis Date  . Atrial flutter (HCC)   . Colon polyps   . Depression   . Diabetes mellitus without complication (HCC)   . Heart disease   . Hypertension   . Sleep apnea     Objective: BP 120/64 (BP Location: Left Arm, Patient Position: Sitting, Cuff Size: Large)   Pulse 64   Temp 98.7 F (37.1 C) (Oral)   Ht 5\' 10"  (1.778 m)   Wt 276 lb 2 oz (125.2 kg)   SpO2 96%   BMI 39.62 kg/m  General: Awake, appears stated age Heart: RRR, no bruits Lungs: CTAB, no rales, wheezes or rhonchi. No accessory muscle use Psych: Age appropriate judgment and insight, normal affect and mood  Assessment and Plan: Benign prostatic hyperplasia with weak urinary stream - Plan: tamsulosin (FLOMAX) 0.4 MG CAPS capsule  Diabetes mellitus type 2 in obese [facility]) - Plan: Ambulatory referral to Endocrinology  Cont above. Refer to [facility], [facility] in this office.  F/u in 6 mo or prn.  The patient voiced understanding and agreement to the plan.  Lonzo Cloud [place], DO 08/06/20  10:26 AM

## 2020-08-07 ENCOUNTER — Ambulatory Visit: Payer: BC Managed Care – PPO | Admitting: Endocrinology

## 2020-08-21 DIAGNOSIS — M9903 Segmental and somatic dysfunction of lumbar region: Secondary | ICD-10-CM | POA: Diagnosis not present

## 2020-08-21 DIAGNOSIS — M9904 Segmental and somatic dysfunction of sacral region: Secondary | ICD-10-CM | POA: Diagnosis not present

## 2020-08-21 DIAGNOSIS — M459 Ankylosing spondylitis of unspecified sites in spine: Secondary | ICD-10-CM | POA: Diagnosis not present

## 2020-08-21 DIAGNOSIS — M4810 Ankylosing hyperostosis [Forestier], site unspecified: Secondary | ICD-10-CM | POA: Diagnosis not present

## 2020-10-01 ENCOUNTER — Encounter: Payer: Self-pay | Admitting: Internal Medicine

## 2020-10-01 ENCOUNTER — Ambulatory Visit: Payer: BC Managed Care – PPO | Admitting: Internal Medicine

## 2020-10-01 ENCOUNTER — Other Ambulatory Visit: Payer: Self-pay

## 2020-10-01 VITALS — BP 142/82 | HR 64 | Ht 70.0 in | Wt 280.0 lb

## 2020-10-01 DIAGNOSIS — E1169 Type 2 diabetes mellitus with other specified complication: Secondary | ICD-10-CM

## 2020-10-01 DIAGNOSIS — E669 Obesity, unspecified: Secondary | ICD-10-CM

## 2020-10-01 DIAGNOSIS — E042 Nontoxic multinodular goiter: Secondary | ICD-10-CM | POA: Diagnosis not present

## 2020-10-01 DIAGNOSIS — E1159 Type 2 diabetes mellitus with other circulatory complications: Secondary | ICD-10-CM | POA: Diagnosis not present

## 2020-10-01 LAB — POCT GLYCOSYLATED HEMOGLOBIN (HGB A1C): Hemoglobin A1C: 6.6 % — AB (ref 4.0–5.6)

## 2020-10-01 MED ORDER — METFORMIN HCL ER 500 MG PO TB24: 1000.0000 mg | ORAL_TABLET | ORAL | 3 refills | Status: DC

## 2020-10-01 NOTE — Patient Instructions (Signed)
-   Continue Metformin 500 mg, 2 tablets with Breakfast and 2 tablet with Supper - Continue Jardiance 10 mg , 1 tablet daily with Breakfast  - Continue Victoza 1.8 mg daily     Let me know before the Victoza is finished when you are ready for me to send a prescription of Trulicity or Ozempic ( once a week injection) vs oral pills ( Rybelsus)       HOW TO TREAT LOW BLOOD SUGARS (Blood sugar LESS THAN 70 MG/DL)  Please follow the RULE OF 15 for the treatment of hypoglycemia treatment (when your (blood sugars are less than 70 mg/dL)    STEP 1: Take 15 grams of carbohydrates when your blood sugar is low, which includes:   3-4 GLUCOSE TABS  OR  3-4 OZ OF JUICE OR REGULAR SODA OR  ONE TUBE OF GLUCOSE GEL     STEP 2: RECHECK blood sugar in 15 MINUTES STEP 3: If your blood sugar is still low at the 15 minute recheck --> then, go back to STEP 1 and treat AGAIN with another 15 grams of carbohydrates.

## 2020-10-01 NOTE — Progress Notes (Signed)
Name: Luke Sims  MRN/ DOB: [id number], 1956/11/25   Age/ Sex: 64 y.o., male    PCP: Sharlene Dory, DO   Reason for Endocrinology Evaluation: Type 2 Diabetes Mellitus     Date of Initial Endocrinology Visit: 10/01/2020     PATIENT IDENTIFIER: Mr. Luke Sims is a 64 y.o. male with a past medical history of T2DM, OSA, A. Flutter, Meniere's disease, CAD  and HTN . The patient presented for initial endocrinology clinic visit on 10/01/2020 for consultative assistance with his diabetes management.    HPI: [name] was    Diagnosed with DM years ago  Prior Medications tried/Intolerance: Jardiance started 12/2018 Currently checking blood sugars  1 x / day Hypoglycemia episodes : no              Hemoglobin A1c has ranged from 7.3% in 2021, peaking at 9.3% in 2019 Patient required assistance for hypoglycemia: no Patient has required hospitalization within the last 1 year from hyper or hypoglycemia: no   In terms of diet, the patient eats 3 meals a day, snacks x1     THYROID HISTORY: He has been diagnosed with MNG in 02/2018 . A left thyroid nodule 2.4 cm ,was amenable for FNA but he was lost to follow up.      HOME DIABETES REGIMEN: Jardiance 10 mg daily  Victoza 1.8 mg daily  Metformin 1000 mg BID   Statin: yes ACE-I/ARB: yes     METER DOWNLOAD SUMMARY:  83- 216 mg/dL    DIABETIC COMPLICATIONS: Microvascular complications:    Denies: CKD, retinopathy neuropathy   Last eye exam: Completed 07/2020  Macrovascular complications:   CAD ( S/P stent )   Denies:  PVD, CVA   PAST HISTORY: Past Medical History:  Past Medical History:  Diagnosis Date  . Atrial flutter (HCC)   . Colon polyps   . Depression   . Diabetes mellitus without complication (HCC)   . Heart disease   . Hypertension   . Sleep apnea    Past Surgical History:  Past Surgical History:  Procedure Laterality Date  . COLONOSCOPY        Social History:  reports that he has  been smoking cigars. He has never used smokeless tobacco. He reports current alcohol use. He reports that he does not use drugs. Family History:  Family History  Problem Relation Age of Onset  . Diabetes Mother   . Heart disease Father   . Hyperlipidemia Father   . Diabetes Sister   . COPD Brother   . Hypertension Son   . Arthritis Sister   . Arthritis Sister      HOME MEDICATIONS: Allergies as of 10/01/2020      Reactions   Ace Inhibitors Cough, Other (See Comments)   Cough      Medication List       Accurate as of October 01, 2020  4:28 PM. If you have any questions, ask your nurse or doctor.        amLODipine 5 MG tablet Commonly known as: NORVASC TAKE 1 TABLET BY MOUTH DAILY   aspirin EC 81 MG tablet Take 81 mg by mouth daily.   Eliquis 5 MG Tabs tablet Generic drug: apixaban Take 5 mg by mouth 2 (two) times daily.   Jardiance 10 MG Tabs tablet Generic drug: empagliflozin Take 10 mg by mouth daily.   metFORMIN 500 MG 24 hr tablet Commonly known as: GLUCOPHAGE-XR Take 2 tablets (1,000 mg total) by  mouth 2 (two) times a week. Start taking on: October 03, 2020 What changed:   medication strength  how much to take  how to take this  when to take this  additional instructions Changed by: Scarlette Shorts, MD   rosuvastatin 20 MG tablet Commonly known as: CRESTOR Take 20 mg by mouth daily.   tamsulosin 0.4 MG Caps capsule Commonly known as: FLOMAX Take 1 capsule (0.4 mg total) by mouth daily.   valsartan-hydrochlorothiazide 320-25 MG tablet Commonly known as: DIOVAN-HCT Take by mouth.   Victoza 18 MG/3ML Sopn Generic drug: liraglutide Inject 1.8 mg into the skin daily.        ALLERGIES: Allergies  Allergen Reactions  . Ace Inhibitors Cough and Other (See Comments)    Cough      REVIEW OF SYSTEMS: A comprehensive ROS was conducted with the patient and is negative except as per HPI and below:  ROS    OBJECTIVE:   VITAL  SIGNS: BP (!) 142/82   Pulse 64   Ht 5\' 10"  (1.778 m)   Wt 280 lb (127 kg)   SpO2 96%   BMI 40.18 kg/m    PHYSICAL EXAM:  General: Pt appears well and is in NAD  Neck: General: Supple without adenopathy or carotid bruits. Thyroid: Thyroid size normal.  No goiter or nodules appreciated. No thyroid bruit.  Lungs: Clear with good BS bilat with no rales, rhonchi, or wheezes  Heart: RRR with normal S1 and S2 and no gallops; no murmurs; no rub  Abdomen: Normoactive bowel sounds, soft, nontender, without masses or organomegaly palpable  Extremities:  Lower extremities - No pretibial edema. No lesions.  Skin: Normal texture and temperature to palpation.   Neuro: MS is good with appropriate affect, pt is alert and Ox3     DATA REVIEWED:  Lab Results  Component Value Date   HGBA1C 6.6 (A) 10/01/2020   HGBA1C 7.3 (H) 07/05/2020   HGBA1C 7.3 (H) 11/06/2019   Lab Results  Component Value Date   MICROALBUR 2.4 (H) 07/05/2020   LDLCALC 29 07/05/2020   CREATININE 0.85 07/05/2020   Lab Results  Component Value Date   MICRALBCREAT 3.4 07/05/2020    Lab Results  Component Value Date   CHOL 85 07/05/2020   HDL 32.90 (L) 07/05/2020   LDLCALC 29 07/05/2020   TRIG 117.0 07/05/2020   CHOLHDL 3 07/05/2020         Thyroid ULtrasound 2019 RIGHT LOBE: The right thyroid lobe appears normal. The right lobe measures 1.8 x 4.7 x 1.5 cm. A single nodule identified labeled #1.  Nodule #1: Located in the medial aspect of the superior thyroid. The nodule is solid and isoechoic with a hypoechoic rim. It measures 0.7 x 0.5 x 0.8 cm.  LEFT LOBE: The left thyroid lobe appears normal. The left lobe measures 2.0 x 5.1 x 2.0 cm. A single nodule identified labeled #1.  Nodule #1: Is located in the inferior lobe is solid and isoechoic. It measures 2.4 x 1.9 x 2.5 cm.  ISTHMUS: The isthmus appears normal and measures 0.3 cm.  Impression: 1. Solid left thyroid nodule measuring up to 2.5 cm.  This would be amenable to ultrasound-guided fine-needle aspiration if clinically indicated. 2. Small solid thyroid of the right thyroid lobe measuring up to 0.8 cm. Attention on follow-up.   ASSESSMENT / PLAN / RECOMMENDATIONS:   1) Type 2 Diabetes Mellitus, Optimally controlled, With macrovascular  complications - Most recent A1c of 6.7%. Goal A1c <  7.0 %.     Plan: GENERAL: I have discussed with the patient the pathophysiology of diabetes. We went over the natural progression of the disease. We talked about both insulin resistance and insulin deficiency. We stressed the importance of lifestyle changes including diet and exercise. I explained the complications associated with diabetes including retinopathy, nephropathy, neuropathy as well as increased risk of cardiovascular disease. We went over the benefit seen with glycemic control.    I explained to the patient that diabetic patients are at higher than normal risk for amputations.   He has been making lifestyle changes and his BG's have been optimal for the most part, he is interested in switching Victoza from daily injections to weekly. We also discussed Rybelsus as an oral option, but after discussing GI side effects, he opted for either Trulicity or Ozempic   He will call me prior to finishing current victoza prescription so we can change it    MEDICATIONS: - Continue Metformin 500 mg, 2 tablets with Breakfast and 2 tablet with Supper - Continue Jardiance 10 mg , 1 tablet daily with Breakfast  - Continue Victoza 1.8 mg daily     EDUCATION / INSTRUCTIONS:  BG monitoring instructions: Patient is instructed to check his blood sugars 1 times a day, fasting   Call [facility] if: BG persistently < 70  . I reviewed the Rule of 15 for the treatment of hypoglycemia in detail with the patient. Literature supplied.   2) Diabetic complications:   Eye: Does not  have known diabetic retinopathy.   Neuro/ Feet: Does  not  have known diabetic peripheral neuropathy.  Renal: Patient does not have known baseline CKD. He is on an ACEI/ARB at present.   3) Dyslipidemia: Patient is on Rosuvastatin 20 mg daily . LDL is at goal . Discussed cardiovascular benefits ofd statins.    4) Multinodular goiter:  - No local neck symptoms  - He was supposed to have an FNA of left thyroid nodule but was lost to follow up, will repeat thyroid ultrasound and proceed from there  - TSH normal     F/U in 3 months    Signed electronically by: Lyndle Herrlich, MD  [facility]  [facility] [address] [place] [place], [place] [address] Phone: [phone] FAX: [phone]   CC: Sharlene Dory, DO [address] [place] [address] Phone: [phone]  Fax: [phone]    Return to Endocrinology clinic as below: Future Appointments  Date Time Provider Department Center  02/04/2021  3:20 PM Kevia Zaucha, Konrad Dolores, MD [facility]  02/07/2021  8:15 AM Carmelia Roller, [name], DO [facility]

## 2020-10-03 ENCOUNTER — Encounter: Payer: Self-pay | Admitting: Internal Medicine

## 2020-10-03 DIAGNOSIS — M459 Ankylosing spondylitis of unspecified sites in spine: Secondary | ICD-10-CM | POA: Diagnosis not present

## 2020-10-03 DIAGNOSIS — E119 Type 2 diabetes mellitus without complications: Secondary | ICD-10-CM | POA: Insufficient documentation

## 2020-10-03 DIAGNOSIS — M9903 Segmental and somatic dysfunction of lumbar region: Secondary | ICD-10-CM | POA: Diagnosis not present

## 2020-10-03 DIAGNOSIS — M9904 Segmental and somatic dysfunction of sacral region: Secondary | ICD-10-CM | POA: Diagnosis not present

## 2020-10-03 DIAGNOSIS — M4810 Ankylosing hyperostosis [Forestier], site unspecified: Secondary | ICD-10-CM | POA: Diagnosis not present

## 2020-10-03 DIAGNOSIS — E042 Nontoxic multinodular goiter: Secondary | ICD-10-CM | POA: Insufficient documentation

## 2020-10-07 ENCOUNTER — Ambulatory Visit (HOSPITAL_BASED_OUTPATIENT_CLINIC_OR_DEPARTMENT_OTHER): Payer: BC Managed Care – PPO

## 2020-10-17 DIAGNOSIS — M9904 Segmental and somatic dysfunction of sacral region: Secondary | ICD-10-CM | POA: Diagnosis not present

## 2020-10-17 DIAGNOSIS — M459 Ankylosing spondylitis of unspecified sites in spine: Secondary | ICD-10-CM | POA: Diagnosis not present

## 2020-10-17 DIAGNOSIS — M9903 Segmental and somatic dysfunction of lumbar region: Secondary | ICD-10-CM | POA: Diagnosis not present

## 2020-10-17 DIAGNOSIS — M4810 Ankylosing hyperostosis [Forestier], site unspecified: Secondary | ICD-10-CM | POA: Diagnosis not present

## 2020-10-18 ENCOUNTER — Other Ambulatory Visit: Payer: Self-pay

## 2020-10-18 ENCOUNTER — Ambulatory Visit (HOSPITAL_BASED_OUTPATIENT_CLINIC_OR_DEPARTMENT_OTHER)
Admission: RE | Admit: 2020-10-18 | Discharge: 2020-10-18 | Disposition: A | Discharge status: Home / Self Care | Payer: BC Managed Care – PPO | Source: Ambulatory Visit | Attending: Internal Medicine | Admitting: Internal Medicine

## 2020-10-18 DIAGNOSIS — E042 Nontoxic multinodular goiter: Secondary | ICD-10-CM | POA: Diagnosis not present

## 2020-10-18 DIAGNOSIS — E041 Nontoxic single thyroid nodule: Secondary | ICD-10-CM | POA: Diagnosis not present

## 2020-10-25 DIAGNOSIS — M4810 Ankylosing hyperostosis [Forestier], site unspecified: Secondary | ICD-10-CM | POA: Diagnosis not present

## 2020-10-25 DIAGNOSIS — M9903 Segmental and somatic dysfunction of lumbar region: Secondary | ICD-10-CM | POA: Diagnosis not present

## 2020-10-25 DIAGNOSIS — M459 Ankylosing spondylitis of unspecified sites in spine: Secondary | ICD-10-CM | POA: Diagnosis not present

## 2020-10-25 DIAGNOSIS — M9904 Segmental and somatic dysfunction of sacral region: Secondary | ICD-10-CM | POA: Diagnosis not present

## 2020-12-12 DIAGNOSIS — M9903 Segmental and somatic dysfunction of lumbar region: Secondary | ICD-10-CM | POA: Diagnosis not present

## 2020-12-12 DIAGNOSIS — M9904 Segmental and somatic dysfunction of sacral region: Secondary | ICD-10-CM | POA: Diagnosis not present

## 2020-12-12 DIAGNOSIS — M4810 Ankylosing hyperostosis [Forestier], site unspecified: Secondary | ICD-10-CM | POA: Diagnosis not present

## 2020-12-12 DIAGNOSIS — M459 Ankylosing spondylitis of unspecified sites in spine: Secondary | ICD-10-CM | POA: Diagnosis not present

## 2021-01-02 ENCOUNTER — Telehealth (INDEPENDENT_AMBULATORY_CARE_PROVIDER_SITE_OTHER): Payer: BC Managed Care – PPO | Admitting: Family Medicine

## 2021-01-02 ENCOUNTER — Other Ambulatory Visit: Payer: Self-pay

## 2021-01-02 DIAGNOSIS — I471 Supraventricular tachycardia, unspecified: Secondary | ICD-10-CM

## 2021-01-02 DIAGNOSIS — J40 Bronchitis, not specified as acute or chronic: Secondary | ICD-10-CM

## 2021-01-02 MED ORDER — DOXYCYCLINE HYCLATE 100 MG PO CAPS: 100.0000 mg | ORAL_CAPSULE | Freq: Two times a day (BID) | ORAL | 0 refills | Status: DC
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
## 2021-01-03 DIAGNOSIS — Z20822 Contact with and (suspected) exposure to covid-19: Secondary | ICD-10-CM | POA: Diagnosis not present

## 2021-01-15 DIAGNOSIS — R809 Proteinuria, unspecified: Secondary | ICD-10-CM | POA: Diagnosis not present

## 2021-01-15 DIAGNOSIS — E1129 Type 2 diabetes mellitus with other diabetic kidney complication: Secondary | ICD-10-CM | POA: Diagnosis not present

## 2021-01-20 ENCOUNTER — Encounter: Payer: Self-pay | Admitting: Family Medicine

## 2021-01-20 ENCOUNTER — Ambulatory Visit (HOSPITAL_BASED_OUTPATIENT_CLINIC_OR_DEPARTMENT_OTHER)
Admission: RE | Admit: 2021-01-20 | Discharge: 2021-01-20 | Disposition: A | Discharge status: Home / Self Care | Payer: BC Managed Care – PPO | Source: Ambulatory Visit | Attending: Family Medicine | Admitting: Family Medicine

## 2021-01-20 ENCOUNTER — Ambulatory Visit: Payer: BC Managed Care – PPO | Admitting: Family Medicine

## 2021-01-20 ENCOUNTER — Other Ambulatory Visit: Payer: Self-pay

## 2021-01-20 VITALS — BP 138/84 | HR 86 | Temp 99.0°F | Ht 70.0 in | Wt 284.1 lb

## 2021-01-20 DIAGNOSIS — M25531 Pain in right wrist: Secondary | ICD-10-CM | POA: Insufficient documentation

## 2021-01-20 DIAGNOSIS — M19031 Primary osteoarthritis, right wrist: Secondary | ICD-10-CM | POA: Diagnosis not present

## 2021-01-20 NOTE — Patient Instructions (Signed)
We will be in touch regarding your X-ray results.  Ice/cold pack over area for 10-15 min twice daily.  Heat (pad or rice pillow in microwave) over affected area, 10-15 minutes twice daily.   OK to take Tylenol 1000 mg (2 extra strength tabs) or 975 mg (3 regular strength tabs) every 6 hours as needed.  Wear the brace at night and during aggravating activities.   Let us know if you need anything.   Wrist and Forearm Exercises Do exercises exactly as told by your health care provider and adjust them as directed. It is normal to feel mild stretching, pulling, tightness, or discomfort as you do these exercises, but you should stop right away if you feel sudden pain or your pain gets worse.   RANGE OF MOTION EXERCISES These exercises warm up your muscles and joints and improve the movement and flexibility of your injured wrist and forearm. These exercises also help to relieve pain, numbness, and tingling. These exercises are done using the muscles in your injured wrist and forearm. Exercise A: Wrist Flexion, Active 1. With your fingers relaxed, bend your wrist forward as far as you can. 2. Hold this position for 30 seconds. Repeat 2 times. Complete this exercise 3 times per week. Exercise B: Wrist Extension, Active 1. With your fingers relaxed, bend your wrist backward as far as you can. 2. Hold this position for 30 seconds. Repeat 2 times. Complete this exercise 3 times per week. Exercise C: Supination, Active  1. Stand or sit with your arms at your sides. 2. Bend your left / right elbow to an "L" shape (90 degrees). 3. Turn your palm upward until you feel a gentle stretch on the inside of your forearm. 4. Hold this position for 30 seconds. 5. Slowly return your palm to the starting position. Repeat 2 times. Complete this exercise 3 times per week. Exercise D: Pronation, Active  1. Stand or sit with your arms at your sides. 2. Bend your left / right elbow to an "L" shape (90  degrees). 3. Turn your palm downward until you feel a gentle stretch on the top of your forearm. 4. Hold this position for 30 seconds. 5. Slowly return your palm to the starting position. Repeat 2 times. Complete this exercise once a day.  STRETCHING EXERCISES These exercises warm up your muscles and joints and improve the movement and flexibility of your injured wrist and forearm. These exercises also help to relieve pain, numbness, and tingling. These exercises are done using your healthy wrist and forearm to help stretch the muscles in your injured wrist and forearm. Exercise E: Wrist Flexion, Passive  1. Extend your left / right arm in front of you, relax your wrist, and point your fingers downward. 2. Gently push on the back of your hand. Stop when you feel a gentle stretch on the top of your forearm. 3. Hold this position for 30 seconds. Repeat 2 times. Complete this exercise 3 times per week. Exercise F: Wrist Extension, Passive  1. Extend your left / right arm in front of you and turn your palm upward. 2. Gently pull your palm and fingertips back so your fingers point downward. You should feel a gentle stretch on the palm-side of your forearm. 3. Hold this position for 30 seconds. Repeat 2 times. Complete this exercise 3 times per week. Exercise G: Forearm Rotation, Supination, Passive 1. Sit with your left / right elbow bent to an "L" shape (90 degrees) with your forearm resting  on a table. 2. Keeping your upper body and shoulder still, use your other hand to rotate your forearm palm-up until you feel a gentle to moderate stretch. 3. Hold this position for 30 seconds. 4. Slowly release the stretch and return to the starting position. Repeat 2 times. Complete this exercise 3 times per week. Exercise H: Forearm Rotation, Pronation, Passive 1. Sit with your left / right elbow bent to an "L" shape (90 degrees) with your forearm resting on a table. 2. Keeping your upper body and  shoulder still, use your other hand to rotate your forearm palm-down until you feel a gentle to moderate stretch. 3. Hold this position for 30 seconds. 4. Slowly release the stretch and return to the starting position. Repeat 2 times. Complete this exercise 3 times per week.  STRENGTHENING EXERCISES These exercises build strength and endurance in your wrist and forearm. Endurance is the ability to use your muscles for a long time, even after they get tired. Exercise I: Wrist Flexors  1. Sit with your left / right forearm supported on a table and your hand resting palm-up over the edge of the table. Your elbow should be bent to an "L" shape (about 90 degrees) and be below the level of your shoulder. 2. Hold a 3-5 lb weight in your left / right hand. Or, hold a rubber exercise band or tube in both hands, keeping your hands at the same level and hip distance apart. There should be a slight tension in the exercise band or tube. 3. Slowly curl your hand up toward your forearm. 4. Hold this position for 3 seconds. 5. Slowly lower your hand back to the starting position. Repeat 2 times. Complete this exercise 3 times per week. Exercise J: Wrist Extensors  1. Sit with your left / right forearm supported on a table and your hand resting palm-down over the edge of the table. Your elbow should be bent to an "L" shape (about 90 degrees) and be below the level of your shoulder. 2. Hold a 3-5 lb weight in your left / right hand. Or, hold a rubber exercise band or tube in both hands, keeping your hands at the same level and hip distance apart. There should be a slight tension in the exercise band or tube. 3. Slowly curl your hand up toward your forearm. 4. Hold this position for 3 seconds. 5. Slowly lower your hand back to the starting position. Repeat 2 times. Complete this exercise 3 times per week. Exercise K: Forearm Rotation, Supination  1. Sit with your left / right forearm supported on a table and  your hand resting palm-down. Your elbow should be at your side, bent to an "L" shape (about 90 degrees), and below the level of your shoulder. Keep your wrist stable and in a neutral position throughout the exercise. 2. Gently hold a lightweight hammer with your left / right hand. 3. Without moving your elbow or wrist, slowly rotate your palm upward to a thumbs-up position. 4. Hold this position for 3 seconds. 5. Slowly return your forearm to the starting position. Repeat 2 times. Complete this exercise 3 times per week. Exercise L: Forearm Rotation, Pronation  1. Sit with your left / right forearm supported on a table and your hand resting palm-up. Your elbow should be at your side, bent to an "L" shape (about 90 degrees), and below the level of your shoulder. Keep your wrist stable. Do not allow it to move backward or forward during  the exercise. 2. Gently hold a lightweight hammer with your left / right hand. 3. Without moving your elbow or wrist, slowly rotate your palm and hand upward to a thumbs-up position. 4. Hold this position for 3 seconds. 5. Slowly return your forearm to the starting position. Repeat 2 times. Complete this exercise 3 times per week. Exercise M: Grip Strengthening  1. Hold one of these items in your left / right hand: play dough, therapy putty, a dense sponge, a stress ball, or a large, rolled sock. 2. Squeeze as hard as you can without increasing pain. 3. Hold this position for 5 seconds. 4. Slowly release your grip. Repeat 2 times. Complete this exercise 3 times per week.  This information is not intended to replace advice given to you by your health care provider. Make sure you discuss any questions you have with your health care provider. Document Released: 10/28/2005 Document Revised: 09/07/2016 Document Reviewed: 09/08/2015 Elsevier Interactive Patient Education  [employer].

## 2021-01-20 NOTE — Progress Notes (Signed)
Musculoskeletal Exam  Patient: Luke Sims DOB: 05-Feb-1956  DOS: 01/20/2021  SUBJECTIVE:  Chief Complaint:   Chief Complaint  Patient presents with  . Wrist Pain    Right wrist fell on the ice    RIGBY LEONHARDT is a 65 y.o.  male for evaluation and treatment of R wrist pain. He is R handed.  Onset:  2 days ago. Fell on ice yesterday.  Location: distal radius and outside of hand Character:  sharp  Progression of issue:  has worsened Associated symptoms: bruising, decreased ROM; no redness or swelling Treatment: to date has been ice and acetaminophen.   Neurovascular symptoms: no  Past Medical History:  Diagnosis Date  . Atrial flutter (HCC)   . Colon polyps   . Depression   . Diabetes mellitus without complication (HCC)   . Heart disease   . Hypertension   . Sleep apnea     Objective: VITAL SIGNS: BP 138/84 (BP Location: Left Arm, Patient Position: Sitting, Cuff Size: Large)   Pulse 86   Temp 99 F (37.2 C) (Oral)   Ht 5\' 10"  (1.778 m)   Wt 284 lb 2 oz (128.9 kg)   SpO2 96%   BMI 40.77 kg/m  Constitutional: Well formed, well developed. No acute distress. Thorax & Lungs: No accessory muscle use Musculoskeletal: R wrist.   Normal active range of motion: no.   Normal passive range of motion: no Tenderness to palpation: yes over distal radius and ulna volar surface Deformity: no Ecchymosis: yes, volar wrist there is a patch of ecchymosis Neurologic: Normal sensory function in hands b/l Psychiatric: Normal mood. Age appropriate judgment and insight. Alert & oriented x 3.    Assessment:  Right wrist pain - Plan: DG Wrist Complete Right  Plan: Stretches/exercises, heat, ice, Tylenol. Wrist brace to wear at night and during aggravating activities. XR unofficially neg, await official read.  F/u prn. The patient voiced understanding and agreement to the plan.   [address] [place], DO 01/20/21  2:53 PM

## 2021-02-04 ENCOUNTER — Ambulatory Visit: Payer: BC Managed Care – PPO | Admitting: Internal Medicine

## 2021-02-07 ENCOUNTER — Ambulatory Visit: Payer: BC Managed Care – PPO | Admitting: Family Medicine

## 2021-02-07 ENCOUNTER — Other Ambulatory Visit: Payer: Self-pay

## 2021-02-07 ENCOUNTER — Encounter: Payer: Self-pay | Admitting: Family Medicine

## 2021-02-07 VITALS — BP 120/80 | HR 62 | Temp 98.0°F | Ht 70.0 in | Wt 280.0 lb

## 2021-02-07 DIAGNOSIS — M545 Low back pain, unspecified: Secondary | ICD-10-CM | POA: Diagnosis not present

## 2021-02-07 DIAGNOSIS — R3912 Poor urinary stream: Secondary | ICD-10-CM | POA: Diagnosis not present

## 2021-02-07 DIAGNOSIS — G8929 Other chronic pain: Secondary | ICD-10-CM

## 2021-02-07 DIAGNOSIS — N401 Enlarged prostate with lower urinary tract symptoms: Secondary | ICD-10-CM | POA: Diagnosis not present

## 2021-02-07 NOTE — Progress Notes (Signed)
Chief Complaint  Patient presents with  . Follow-up     Subjective Luke Sims is a 65 y.o. male who presents for BPH f/u.  Taking Flomax 0.4 mg/d. He reports compliance and denies adverse effects. His symptoms are well controlled.  The patient has a history of low back tightness/pain.  He had an x-ray in 2017 that showed possible DISH.  He does not do any stretches or exercises dedicated to help with low back.  He is wondering if he needs any follow-up or specific therapy for this.  Past Medical History:  Diagnosis Date  . Atrial flutter (HCC)   . Colon polyps   . Depression   . Diabetes mellitus without complication (HCC)   . DISH (diffuse idiopathic skeletal hyperostosis)   . Heart disease   . Hypertension   . Sleep apnea    Exam BP 120/80 (BP Location: Left Arm, Patient Position: Sitting, Cuff Size: Normal)   Pulse 62   Temp 98 F (36.7 C) (Oral)   Ht 5\' 10"  (1.778 m)   Wt 280 lb (127 kg)   SpO2 97%   BMI 40.18 kg/m  General:  well developed, well nourished, in no apparent distress Lungs:  clear to auscultation, breath sounds equal bilaterally; normal effort without accessory muscle use Abd: BS+, S, NT, ND Cardio :  regular rate and rhythm Rectal:  Defer Psych: well oriented with normal range of affect and appropriate judgement/insight  Assessment and Plan  Benign prostatic hyperplasia with weak urinary stream  Chronic bilateral low back pain without sciatica  1. Cont Flomax 0.4 mg/d.  2. Heat, ice, Tylenol. Stretches/exercises. He will let know if things change. Consider PT. CPE in 6 mo or f/u prn.  The patient voiced understanding and agreement to the plan.  [place], DO 02/07/21 10:37 AM

## 2021-02-07 NOTE — Patient Instructions (Addendum)
Heat (pad or rice pillow in microwave) over affected area, 10-15 minutes twice daily.   OK to take Tylenol 1000 mg (2 extra strength tabs) or 975 mg (3 regular strength tabs) every 6 hours as needed.  EXERCISES  RANGE OF MOTION (ROM) AND STRETCHING EXERCISES - Low Back Pain Most people with lower back pain will find that their symptoms get worse with excessive bending forward (flexion) or arching at the lower back (extension). The exercises that will help resolve your symptoms will focus on the opposite motion.  If you have pain, numbness or tingling which travels down into your buttocks, leg or foot, the goal of the therapy is for these symptoms to move closer to your back and eventually resolve. Sometimes, these leg symptoms will get better, but your lower back pain may worsen. This is often an indication of progress in your rehabilitation. Be very alert to any changes in your symptoms and the activities in which you participated in the 24 hours prior to the change. Sharing this information with your caregiver will allow him or her to most efficiently treat your condition. These exercises may help you when beginning to rehabilitate your injury. Your symptoms may resolve with or without further involvement from your physician, physical therapist or athletic trainer. While completing these exercises, remember:   Restoring tissue flexibility helps normal motion to return to the joints. This allows healthier, less painful movement and activity.  An effective stretch should be held for at least 30 seconds.  A stretch should never be painful. You should only feel a gentle lengthening or release in the stretched tissue. FLEXION RANGE OF MOTION AND STRETCHING EXERCISES:  STRETCH - Flexion, Single Knee to Chest   Lie on a firm bed or floor with both legs extended in front of you.  Keeping one leg in contact with the floor, bring your opposite knee to your chest. Hold your leg in place by either  grabbing behind your thigh or at your knee.  Pull until you feel a gentle stretch in your low back. Hold 30 seconds.  Slowly release your grasp and repeat the exercise with the opposite side. Repeat 2 times. Complete this exercise 3 times per week.   STRETCH - Flexion, Double Knee to Chest  Lie on a firm bed or floor with both legs extended in front of you.  Keeping one leg in contact with the floor, bring your opposite knee to your chest.  Tense your stomach muscles to support your back and then lift your other knee to your chest. Hold your legs in place by either grabbing behind your thighs or at your knees.  Pull both knees toward your chest until you feel a gentle stretch in your low back. Hold 30 seconds.  Tense your stomach muscles and slowly return one leg at a time to the floor. Repeat 2 times. Complete this exercise 3 times per week.   STRETCH - Low Trunk Rotation  Lie on a firm bed or floor. Keeping your legs in front of you, bend your knees so they are both pointed toward the ceiling and your feet are flat on the floor.  Extend your arms out to the side. This will stabilize your upper body by keeping your shoulders in contact with the floor.  Gently and slowly drop both knees together to one side until you feel a gentle stretch in your low back. Hold for 30 seconds.  Tense your stomach muscles to support your lower back as you   bring your knees back to the starting position. Repeat the exercise to the other side. Repeat 2 times. Complete this exercise at least 3 times per week.   EXTENSION RANGE OF MOTION AND FLEXIBILITY EXERCISES:  STRETCH - Extension, Prone on Elbows   Lie on your stomach on the floor, a bed will be too soft. Place your palms about shoulder width apart and at the height of your head.  Place your elbows under your shoulders. If this is too painful, stack pillows under your chest.  Allow your body to relax so that your hips drop lower and make contact  more completely with the floor.  Hold this position for 30 seconds.  Slowly return to lying flat on the floor. Repeat 2 times. Complete this exercise 3 times per week.   RANGE OF MOTION - Extension, Prone Press Ups  Lie on your stomach on the floor, a bed will be too soft. Place your palms about shoulder width apart and at the height of your head.  Keeping your back as relaxed as possible, slowly straighten your elbows while keeping your hips on the floor. You may adjust the placement of your hands to maximize your comfort. As you gain motion, your hands will come more underneath your shoulders.  Hold this position 30 seconds.  Slowly return to lying flat on the floor. Repeat 2 times. Complete this exercise 3 times per week.   RANGE OF MOTION- Quadruped, Neutral Spine   Assume a hands and knees position on a firm surface. Keep your hands under your shoulders and your knees under your hips. You may place padding under your knees for comfort.  Drop your head and point your tailbone toward the ground below you. This will round out your lower back like an angry cat. Hold this position for 30 seconds.  Slowly lift your head and release your tail bone so that your back sags into a large arch, like an old horse.  Hold this position for 30 seconds.  Repeat this until you feel limber in your low back.  Now, find your "sweet spot." This will be the most comfortable position somewhere between the two previous positions. This is your neutral spine. Once you have found this position, tense your stomach muscles to support your low back.  Hold this position for 30 seconds. Repeat 2 times. Complete this exercise 3 times per week.   STRENGTHENING EXERCISES - Low Back Sprain These exercises may help you when beginning to rehabilitate your injury. These exercises should be done near your "sweet spot." This is the neutral, low-back arch, somewhere between fully rounded and fully arched, that is your  least painful position. When performed in this safe range of motion, these exercises can be used for people who have either a flexion or extension based injury. These exercises may resolve your symptoms with or without further involvement from your physician, physical therapist or athletic trainer. While completing these exercises, remember:   Muscles can gain both the endurance and the strength needed for everyday activities through controlled exercises.  Complete these exercises as instructed by your physician, physical therapist or athletic trainer. Increase the resistance and repetitions only as guided.  You may experience muscle soreness or fatigue, but the pain or discomfort you are trying to eliminate should never worsen during these exercises. If this pain does worsen, stop and make certain you are following the directions exactly. If the pain is still present after adjustments, discontinue the exercise until you can discuss   the trouble with your caregiver.  STRENGTHENING - Deep Abdominals, Pelvic Tilt   Lie on a firm bed or floor. Keeping your legs in front of you, bend your knees so they are both pointed toward the ceiling and your feet are flat on the floor.  Tense your lower abdominal muscles to press your low back into the floor. This motion will rotate your pelvis so that your tail bone is scooping upwards rather than pointing at your feet or into the floor. With a gentle tension and even breathing, hold this position for 3 seconds. Repeat 2 times. Complete this exercise 3 times per week.   STRENGTHENING - Abdominals, Crunches   Lie on a firm bed or floor. Keeping your legs in front of you, bend your knees so they are both pointed toward the ceiling and your feet are flat on the floor. Cross your arms over your chest.  Slightly tip your chin down without bending your neck.  Tense your abdominals and slowly lift your trunk high enough to just clear your shoulder blades. Lifting  higher can put excessive stress on the lower back and does not further strengthen your abdominal muscles.  Control your return to the starting position. Repeat 2 times. Complete this exercise 3 times per week.   STRENGTHENING - Quadruped, Opposite UE/LE Lift   Assume a hands and knees position on a firm surface. Keep your hands under your shoulders and your knees under your hips. You may place padding under your knees for comfort.  Find your neutral spine and gently tense your abdominal muscles so that you can maintain this position. Your shoulders and hips should form a rectangle that is parallel with the floor and is not twisted.  Keeping your trunk steady, lift your right hand no higher than your shoulder and then your left leg no higher than your hip. Make sure you are not holding your breath. Hold this position for 30 seconds.  Continuing to keep your abdominal muscles tense and your back steady, slowly return to your starting position. Repeat with the opposite arm and leg. Repeat 2 times. Complete this exercise 3 times per week.   STRENGTHENING - Abdominals and Quadriceps, Straight Leg Raise   Lie on a firm bed or floor with both legs extended in front of you.  Keeping one leg in contact with the floor, bend the other knee so that your foot can rest flat on the floor.  Find your neutral spine, and tense your abdominal muscles to maintain your spinal position throughout the exercise.  Slowly lift your straight leg off the floor about 6 inches for a count of 3, making sure to not hold your breath.  Still keeping your neutral spine, slowly lower your leg all the way to the floor. Repeat this exercise with each leg 2 times. Complete this exercise 3 times per week.  POSTURE AND BODY MECHANICS CONSIDERATIONS - Low Back Sprain Keeping correct posture when sitting, standing or completing your activities will reduce the stress put on different body tissues, allowing injured tissues a  chance to heal and limiting painful experiences. The following are general guidelines for improved posture.  While reading these guidelines, remember:  The exercises prescribed by your provider will help you have the flexibility and strength to maintain correct postures.  The correct posture provides the best environment for your joints to work. All of your joints have less wear and tear when properly supported by a spine with good posture. This means you   will experience a healthier, less painful body.  Correct posture must be practiced with all of your activities, especially prolonged sitting and standing. Correct posture is as important when doing repetitive low-stress activities (typing) as it is when doing a single heavy-load activity (lifting).  RESTING POSITIONS Consider which positions are most painful for you when choosing a resting position. If you have pain with flexion-based activities (sitting, bending, stooping, squatting), choose a position that allows you to rest in a less flexed posture. You would want to avoid curling into a fetal position on your side. If your pain worsens with extension-based activities (prolonged standing, working overhead), avoid resting in an extended position such as sleeping on your stomach. Most people will find more comfort when they rest with their spine in a more neutral position, neither too rounded nor too arched. Lying on a non-sagging bed on your side with a pillow between your knees, or on your back with a pillow under your knees will often provide some relief. Keep in mind, being in any one position for a prolonged period of time, no matter how correct your posture, can still lead to stiffness.  PROPER SITTING POSTURE In order to minimize stress and discomfort on your spine, you must sit with correct posture. Sitting with good posture should be effortless for a healthy body. Returning to good posture is a gradual process. Many people can work toward this  most comfortably by using various supports until they have the flexibility and strength to maintain this posture on their own. When sitting with proper posture, your ears will fall over your shoulders and your shoulders will fall over your hips. You should use the back of the chair to support your upper back. Your lower back will be in a neutral position, just slightly arched. You may place a small pillow or folded towel at the base of your lower back for  support.  When working at a desk, create an environment that supports good, upright posture. Without extra support, muscles tire, which leads to excessive strain on joints and other tissues. Keep these recommendations in mind:  CHAIR:  A chair should be able to slide under your desk when your back makes contact with the back of the chair. This allows you to work closely.  The chair's height should allow your eyes to be level with the upper part of your monitor and your hands to be slightly lower than your elbows.  BODY POSITION  Your feet should make contact with the floor. If this is not possible, use a foot rest.  Keep your ears over your shoulders. This will reduce stress on your neck and low back.  INCORRECT SITTING POSTURES  If you are feeling tired and unable to assume a healthy sitting posture, do not slouch or slump. This puts excessive strain on your back tissues, causing more damage and pain. Healthier options include:  Using more support, like a lumbar pillow.  Switching tasks to something that requires you to be upright or walking.  Talking a brief walk.  Lying down to rest in a neutral-spine position.  PROLONGED STANDING WHILE SLIGHTLY LEANING FORWARD  When completing a task that requires you to lean forward while standing in one place for a long time, place either foot up on a stationary 2-4 inch high object to help maintain the best posture. When both feet are on the ground, the lower back tends to lose its slight inward  curve. If this curve flattens (or becomes too   large), then the back and your other joints will experience too much stress, tire more quickly, and can cause pain.  CORRECT STANDING POSTURES Proper standing posture should be assumed with all daily activities, even if they only take a few moments, like when brushing your teeth. As in sitting, your ears should fall over your shoulders and your shoulders should fall over your hips. You should keep a slight tension in your abdominal muscles to brace your spine. Your tailbone should point down to the ground, not behind your body, resulting in an over-extended swayback posture.   INCORRECT STANDING POSTURES  Common incorrect standing postures include a forward head, locked knees and/or an excessive swayback. WALKING Walk with an upright posture. Your ears, shoulders and hips should all line-up.  PROLONGED ACTIVITY IN A FLEXED POSITION When completing a task that requires you to bend forward at your waist or lean over a low surface, try to find a way to stabilize 3 out of 4 of your limbs. You can place a hand or elbow on your thigh or rest a knee on the surface you are reaching across. This will provide you more stability, so that your muscles do not tire as quickly. By keeping your knees relaxed, or slightly bent, you will also reduce stress across your lower back. CORRECT LIFTING TECHNIQUES  DO :  Assume a wide stance. This will provide you more stability and the opportunity to get as close as possible to the object which you are lifting.  Tense your abdominals to brace your spine. Bend at the knees and hips. Keeping your back locked in a neutral-spine position, lift using your leg muscles. Lift with your legs, keeping your back straight.  Test the weight of unknown objects before attempting to lift them.  Try to keep your elbows locked down at your sides in order get the best strength from your shoulders when carrying an object.     Always ask  for help when lifting heavy or awkward objects. INCORRECT LIFTING TECHNIQUES DO NOT:   Lock your knees when lifting, even if it is a small object.  Bend and twist. Pivot at your feet or move your feet when needing to change directions.  Assume that you can safely pick up even a paperclip without proper posture.   

## 2021-02-11 DIAGNOSIS — M9903 Segmental and somatic dysfunction of lumbar region: Secondary | ICD-10-CM | POA: Diagnosis not present

## 2021-02-11 DIAGNOSIS — M9904 Segmental and somatic dysfunction of sacral region: Secondary | ICD-10-CM | POA: Diagnosis not present

## 2021-02-11 DIAGNOSIS — M4810 Ankylosing hyperostosis [Forestier], site unspecified: Secondary | ICD-10-CM | POA: Diagnosis not present

## 2021-02-11 DIAGNOSIS — M459 Ankylosing spondylitis of unspecified sites in spine: Secondary | ICD-10-CM | POA: Diagnosis not present

## 2021-02-27 DIAGNOSIS — M459 Ankylosing spondylitis of unspecified sites in spine: Secondary | ICD-10-CM | POA: Diagnosis not present

## 2021-02-27 DIAGNOSIS — M9904 Segmental and somatic dysfunction of sacral region: Secondary | ICD-10-CM | POA: Diagnosis not present

## 2021-02-27 DIAGNOSIS — M9903 Segmental and somatic dysfunction of lumbar region: Secondary | ICD-10-CM | POA: Diagnosis not present

## 2021-02-27 DIAGNOSIS — M4810 Ankylosing hyperostosis [Forestier], site unspecified: Secondary | ICD-10-CM | POA: Diagnosis not present

## 2021-03-13 DIAGNOSIS — M459 Ankylosing spondylitis of unspecified sites in spine: Secondary | ICD-10-CM | POA: Diagnosis not present

## 2021-03-13 DIAGNOSIS — M9903 Segmental and somatic dysfunction of lumbar region: Secondary | ICD-10-CM | POA: Diagnosis not present

## 2021-03-13 DIAGNOSIS — M4810 Ankylosing hyperostosis [Forestier], site unspecified: Secondary | ICD-10-CM | POA: Diagnosis not present

## 2021-03-13 DIAGNOSIS — M9904 Segmental and somatic dysfunction of sacral region: Secondary | ICD-10-CM | POA: Diagnosis not present

## 2021-04-10 DIAGNOSIS — M459 Ankylosing spondylitis of unspecified sites in spine: Secondary | ICD-10-CM | POA: Diagnosis not present

## 2021-04-10 DIAGNOSIS — M9904 Segmental and somatic dysfunction of sacral region: Secondary | ICD-10-CM | POA: Diagnosis not present

## 2021-04-10 DIAGNOSIS — M4810 Ankylosing hyperostosis [Forestier], site unspecified: Secondary | ICD-10-CM | POA: Diagnosis not present

## 2021-04-10 DIAGNOSIS — M9903 Segmental and somatic dysfunction of lumbar region: Secondary | ICD-10-CM | POA: Diagnosis not present

## 2021-04-24 DIAGNOSIS — M9903 Segmental and somatic dysfunction of lumbar region: Secondary | ICD-10-CM | POA: Diagnosis not present

## 2021-04-24 DIAGNOSIS — M4810 Ankylosing hyperostosis [Forestier], site unspecified: Secondary | ICD-10-CM | POA: Diagnosis not present

## 2021-04-24 DIAGNOSIS — M9904 Segmental and somatic dysfunction of sacral region: Secondary | ICD-10-CM | POA: Diagnosis not present

## 2021-04-24 DIAGNOSIS — M459 Ankylosing spondylitis of unspecified sites in spine: Secondary | ICD-10-CM | POA: Diagnosis not present

## 2021-04-25 ENCOUNTER — Ambulatory Visit
Admission: RE | Admit: 2021-04-25 | Discharge: 2021-04-25 | Disposition: A | Discharge status: Home / Self Care | Payer: BC Managed Care – PPO | Source: Ambulatory Visit | Attending: Chiropractic Medicine | Admitting: Chiropractic Medicine

## 2021-04-25 ENCOUNTER — Other Ambulatory Visit: Payer: Self-pay

## 2021-04-25 ENCOUNTER — Other Ambulatory Visit: Payer: Self-pay | Admitting: Chiropractic Medicine

## 2021-04-25 DIAGNOSIS — M47814 Spondylosis without myelopathy or radiculopathy, thoracic region: Secondary | ICD-10-CM | POA: Diagnosis not present

## 2021-04-25 DIAGNOSIS — W19XXXA Unspecified fall, initial encounter: Secondary | ICD-10-CM

## 2021-04-25 DIAGNOSIS — M47812 Spondylosis without myelopathy or radiculopathy, cervical region: Secondary | ICD-10-CM | POA: Diagnosis not present

## 2021-04-25 DIAGNOSIS — M545 Low back pain, unspecified: Secondary | ICD-10-CM | POA: Diagnosis not present

## 2021-04-29 DIAGNOSIS — M459 Ankylosing spondylitis of unspecified sites in spine: Secondary | ICD-10-CM | POA: Diagnosis not present

## 2021-04-29 DIAGNOSIS — M9903 Segmental and somatic dysfunction of lumbar region: Secondary | ICD-10-CM | POA: Diagnosis not present

## 2021-04-29 DIAGNOSIS — M9904 Segmental and somatic dysfunction of sacral region: Secondary | ICD-10-CM | POA: Diagnosis not present

## 2021-04-29 DIAGNOSIS — M4810 Ankylosing hyperostosis [Forestier], site unspecified: Secondary | ICD-10-CM | POA: Diagnosis not present

## 2021-04-30 ENCOUNTER — Other Ambulatory Visit: Payer: Self-pay | Admitting: Family Medicine

## 2021-04-30 DIAGNOSIS — N401 Enlarged prostate with lower urinary tract symptoms: Secondary | ICD-10-CM

## 2021-04-30 MED ORDER — TAMSULOSIN HCL 0.4 MG PO CAPS: ORAL_CAPSULE | ORAL | 2 refills | Status: DC

## 2021-05-15 DIAGNOSIS — M9904 Segmental and somatic dysfunction of sacral region: Secondary | ICD-10-CM | POA: Diagnosis not present

## 2021-05-15 DIAGNOSIS — M459 Ankylosing spondylitis of unspecified sites in spine: Secondary | ICD-10-CM | POA: Diagnosis not present

## 2021-05-15 DIAGNOSIS — M9903 Segmental and somatic dysfunction of lumbar region: Secondary | ICD-10-CM | POA: Diagnosis not present

## 2021-05-15 DIAGNOSIS — M4810 Ankylosing hyperostosis [Forestier], site unspecified: Secondary | ICD-10-CM | POA: Diagnosis not present

## 2021-06-03 DIAGNOSIS — M9903 Segmental and somatic dysfunction of lumbar region: Secondary | ICD-10-CM | POA: Diagnosis not present

## 2021-06-03 DIAGNOSIS — M9904 Segmental and somatic dysfunction of sacral region: Secondary | ICD-10-CM | POA: Diagnosis not present

## 2021-06-03 DIAGNOSIS — M459 Ankylosing spondylitis of unspecified sites in spine: Secondary | ICD-10-CM | POA: Diagnosis not present

## 2021-06-03 DIAGNOSIS — M4810 Ankylosing hyperostosis [Forestier], site unspecified: Secondary | ICD-10-CM | POA: Diagnosis not present

## 2021-06-27 DIAGNOSIS — I2510 Atherosclerotic heart disease of native coronary artery without angina pectoris: Secondary | ICD-10-CM | POA: Diagnosis not present

## 2021-06-27 DIAGNOSIS — I4510 Unspecified right bundle-branch block: Secondary | ICD-10-CM | POA: Diagnosis not present

## 2021-06-27 DIAGNOSIS — I480 Paroxysmal atrial fibrillation: Secondary | ICD-10-CM | POA: Diagnosis not present

## 2021-07-18 DIAGNOSIS — M459 Ankylosing spondylitis of unspecified sites in spine: Secondary | ICD-10-CM | POA: Diagnosis not present

## 2021-07-18 DIAGNOSIS — M4810 Ankylosing hyperostosis [Forestier], site unspecified: Secondary | ICD-10-CM | POA: Diagnosis not present

## 2021-07-18 DIAGNOSIS — M9903 Segmental and somatic dysfunction of lumbar region: Secondary | ICD-10-CM | POA: Diagnosis not present

## 2021-07-18 DIAGNOSIS — M9904 Segmental and somatic dysfunction of sacral region: Secondary | ICD-10-CM | POA: Diagnosis not present

## 2021-07-22 DIAGNOSIS — M4810 Ankylosing hyperostosis [Forestier], site unspecified: Secondary | ICD-10-CM | POA: Diagnosis not present

## 2021-07-22 DIAGNOSIS — M9903 Segmental and somatic dysfunction of lumbar region: Secondary | ICD-10-CM | POA: Diagnosis not present

## 2021-07-22 DIAGNOSIS — M9904 Segmental and somatic dysfunction of sacral region: Secondary | ICD-10-CM | POA: Diagnosis not present

## 2021-07-22 DIAGNOSIS — M459 Ankylosing spondylitis of unspecified sites in spine: Secondary | ICD-10-CM | POA: Diagnosis not present

## 2021-07-31 DIAGNOSIS — M459 Ankylosing spondylitis of unspecified sites in spine: Secondary | ICD-10-CM | POA: Diagnosis not present

## 2021-07-31 DIAGNOSIS — M4810 Ankylosing hyperostosis [Forestier], site unspecified: Secondary | ICD-10-CM | POA: Diagnosis not present

## 2021-07-31 DIAGNOSIS — M9904 Segmental and somatic dysfunction of sacral region: Secondary | ICD-10-CM | POA: Diagnosis not present

## 2021-07-31 DIAGNOSIS — M9903 Segmental and somatic dysfunction of lumbar region: Secondary | ICD-10-CM | POA: Diagnosis not present

## 2021-08-12 ENCOUNTER — Other Ambulatory Visit: Payer: Self-pay

## 2021-08-12 ENCOUNTER — Ambulatory Visit (INDEPENDENT_AMBULATORY_CARE_PROVIDER_SITE_OTHER): Payer: BC Managed Care – PPO | Admitting: Family Medicine

## 2021-08-12 ENCOUNTER — Encounter: Payer: Self-pay | Admitting: Family Medicine

## 2021-08-12 VITALS — BP 130/86 | HR 61 | Temp 98.2°F | Ht 70.0 in | Wt 272.0 lb

## 2021-08-12 DIAGNOSIS — Z125 Encounter for screening for malignant neoplasm of prostate: Secondary | ICD-10-CM

## 2021-08-12 DIAGNOSIS — Z0000 Encounter for general adult medical examination without abnormal findings: Secondary | ICD-10-CM | POA: Diagnosis not present

## 2021-08-12 DIAGNOSIS — E785 Hyperlipidemia, unspecified: Secondary | ICD-10-CM | POA: Diagnosis not present

## 2021-08-12 DIAGNOSIS — E1169 Type 2 diabetes mellitus with other specified complication: Secondary | ICD-10-CM | POA: Diagnosis not present

## 2021-08-12 LAB — COMPREHENSIVE METABOLIC PANEL
ALT: 24 U/L (ref 0–53)
AST: 20 U/L (ref 0–37)
Albumin: 4.6 g/dL (ref 3.5–5.2)
Alkaline Phosphatase: 48 U/L (ref 39–117)
BUN: 11 mg/dL (ref 6–23)
CO2: 29 mEq/L (ref 19–32)
Calcium: 10.3 mg/dL (ref 8.4–10.5)
Chloride: 103 mEq/L (ref 96–112)
Creatinine, Ser: 0.88 mg/dL (ref 0.40–1.50)
GFR: 91.57 mL/min (ref >60.00)
Glucose, Bld: 104 mg/dL — ABNORMAL HIGH (ref 70–99)
Potassium: 3.8 mEq/L (ref 3.5–5.1)
Sodium: 140 mEq/L (ref 135–145)
Total Bilirubin: 0.8 mg/dL (ref 0.2–1.2)
Total Protein: 7.2 g/dL (ref 6.0–8.3)

## 2021-08-12 LAB — LIPID PANEL
Cholesterol: 93 mg/dL (ref 0–200)
HDL: 34.4 mg/dL — ABNORMAL LOW (ref >39.00)
LDL Cholesterol: 27 mg/dL (ref 0–99)
NonHDL: 58.55
Total CHOL/HDL Ratio: 3
Triglycerides: 157 mg/dL — ABNORMAL HIGH (ref 0.0–149.0)
VLDL: 31.4 mg/dL (ref 0.0–40.0)

## 2021-08-12 LAB — HEMOGLOBIN A1C: Hgb A1c MFr Bld: 6.5 % (ref 4.6–6.5)

## 2021-08-12 LAB — CBC
HCT: 45.5 % (ref 39.0–52.0)
Hemoglobin: 15.5 g/dL (ref 13.0–17.0)
MCHC: 34.2 g/dL (ref 30.0–36.0)
MCV: 86.9 fl (ref 78.0–100.0)
Platelets: 193 10*3/uL (ref 150.0–400.0)
RBC: 5.23 Mil/uL (ref 4.22–5.81)
RDW: 14 % (ref 11.5–15.5)
WBC: 5 10*3/uL (ref 4.0–10.5)

## 2021-08-12 LAB — PSA: PSA: 1.75 ng/mL (ref 0.10–4.00)

## 2021-08-12 MED ORDER — VALSARTAN-HYDROCHLOROTHIAZIDE 320-25 MG PO TABS: 1.0000 | ORAL_TABLET | Freq: Every day | ORAL | 0 refills | Status: DC

## 2021-08-12 NOTE — Progress Notes (Signed)
Chief Complaint  Patient presents with   Annual Exam    Well Male Luke Sims is here for a complete physical.   His last physical was >1 year ago.  Current diet: in general, a "healthy" diet.  Current exercise: walking Weight trend: intentionally lost 8 lbs over past 6 mo Fatigue out of ordinary? No. Seat belt? Yes.    Health maintenance Shingrix- No Colonoscopy- Yes Tetanus- Yes HIV- Yes Hep C- Yes   Past Medical History:  Diagnosis Date   Atrial flutter (HCC)    Colon polyps    Depression    Diabetes mellitus without complication (HCC)    DISH (diffuse idiopathic skeletal hyperostosis)    Heart disease    Hypertension    Sleep apnea     Past Surgical History:  Procedure Laterality Date   COLONOSCOPY      Medications  Current Outpatient Medications on File Prior to Visit  Medication Sig Dispense Refill   amLODipine (NORVASC) 5 MG tablet TAKE 1 TABLET BY MOUTH DAILY     apixaban (ELIQUIS) 5 MG TABS tablet Take 5 mg by mouth 2 (two) times daily.      aspirin EC 81 MG tablet Take 81 mg by mouth daily.     empagliflozin (JARDIANCE) 10 MG TABS tablet Take 10 mg by mouth daily.     metFORMIN (GLUCOPHAGE-XR) 500 MG 24 hr tablet Take 2 tablets (1,000 mg total) by mouth 2 (two) times a week. 360 tablet 3   rosuvastatin (CRESTOR) 20 MG tablet Take 20 mg by mouth daily.     Semaglutide,0.25 or 0.5MG /DOS, 2 MG/1.5ML SOPN Inject 0.5 mg into the skin once a week.     tamsulosin (FLOMAX) 0.4 MG CAPS capsule TAKE 1 CAPSULE(0.4 MG) BY MOUTH DAILY 90 capsule 2   valsartan-hydrochlorothiazide (DIOVAN-HCT) 320-25 MG tablet Take by mouth.      Allergies Allergies  Allergen Reactions   Ace Inhibitors Cough and Other (See Comments)    Cough     Family History Family History  Problem Relation Age of Onset   Diabetes Mother    Heart disease Father    Hyperlipidemia Father    Diabetes Sister    COPD Brother    Hypertension Son    Arthritis Sister    Arthritis Sister      Review of Systems: Constitutional:  no fevers Eye:  no recent significant change in vision Ear/Nose/Mouth/Throat:  Ears:  no recent hearing loss Nose/Mouth/Throat:  no complaints of nasal congestion, no sore throat Cardiovascular:  no chest pain Respiratory:  no shortness of breath Gastrointestinal:  no change in bowel habits GU:  Male: negative for dysuria, frequency Musculoskeletal/Extremities:  +back pain Integumentary (Skin/Breast):  no abnormal skin lesions reported Neurologic:  no headaches Endocrine: No unexpected weight changes Hematologic/Lymphatic:  no abnormal bleeding  Exam BP 130/86   Pulse 61   Temp 98.2 F (36.8 C) (Oral)   Ht 5\' 10"  (1.778 m)   Wt 272 lb (123.4 kg)   SpO2 94%   BMI 39.03 kg/m  General:  well developed, well nourished, in no apparent distress Skin:  no significant moles, warts, or growths Head:  no masses, lesions, or tenderness Eyes:  pupils equal and round, sclera anicteric without injection Ears:  canals without lesions, TMs shiny without retraction, no obvious effusion, no erythema Nose:  nares patent, septum midline, mucosa normal Throat/Pharynx:  lips and gingiva without lesion; tongue and uvula midline; non-inflamed pharynx; no exudates or postnasal drainage Neck:  neck supple without adenopathy, thyromegaly, or masses Cardiac: RRR, no bruits, no LE edema Lungs:  clear to auscultation, breath sounds equal bilaterally, no respiratory distress Abdomen: BS+, soft, non-tender, non-distended, no masses or organomegaly noted Rectal: Deferred Musculoskeletal:  symmetrical muscle groups noted without atrophy or deformity; poor hamstring flexibility b/l Neuro:  gait normal; deep tendon reflexes normal and symmetric Psych: well oriented with normal range of affect and appropriate judgment/insight  Assessment and Plan  Well adult exam - Plan: CBC, Comprehensive metabolic panel, Lipid panel  Screening for prostate cancer - Plan:  PSA  Hyperlipidemia associated with type 2 diabetes mellitus (HCC) - Plan: Hemoglobin A1c   Well 65 y.o. male. Counseled on diet and exercise. Stretching encouraged particularly for his lower back.  Counseled on risks and benefits of prostate cancer screening with PSA. The patient agrees to undergo testing. Immunizations, labs, and further orders as above. Follow up in 6 mo or prn. The patient voiced understanding and agreement to the plan.  [name] [place], DO 08/12/21 7:56 AM

## 2021-08-12 NOTE — Patient Instructions (Addendum)
Give us 2-3 business days to get the results of your labs back.   Keep the diet clean and stay active.  I recommend getting the flu shot in mid October. This suggestion would change if the CDC comes out with a different recommendation.   The new Shingrix vaccine (for shingles) is a 2 shot series. It can make people feel low energy, achy and almost like they have the flu for 48 hours after injection. Please plan accordingly when deciding on when to get this shot. Call our office for a nurse visit appointment to get this. The second shot of the series is less severe regarding the side effects, but it still lasts 48 hours.   Let us know if you need anything. 

## 2021-08-13 ENCOUNTER — Encounter: Payer: BC Managed Care – PPO | Admitting: Family Medicine

## 2021-09-04 DIAGNOSIS — G4733 Obstructive sleep apnea (adult) (pediatric): Secondary | ICD-10-CM | POA: Diagnosis not present

## 2021-09-04 DIAGNOSIS — Z6841 Body Mass Index (BMI) 40.0 and over, adult: Secondary | ICD-10-CM | POA: Diagnosis not present

## 2021-09-23 DIAGNOSIS — M459 Ankylosing spondylitis of unspecified sites in spine: Secondary | ICD-10-CM | POA: Diagnosis not present

## 2021-09-23 DIAGNOSIS — M4810 Ankylosing hyperostosis [Forestier], site unspecified: Secondary | ICD-10-CM | POA: Diagnosis not present

## 2021-09-23 DIAGNOSIS — M9903 Segmental and somatic dysfunction of lumbar region: Secondary | ICD-10-CM | POA: Diagnosis not present

## 2021-09-23 DIAGNOSIS — M9904 Segmental and somatic dysfunction of sacral region: Secondary | ICD-10-CM | POA: Diagnosis not present

## 2021-10-06 DIAGNOSIS — H43812 Vitreous degeneration, left eye: Secondary | ICD-10-CM | POA: Diagnosis not present

## 2021-10-06 DIAGNOSIS — Z7984 Long term (current) use of oral hypoglycemic drugs: Secondary | ICD-10-CM | POA: Diagnosis not present

## 2021-10-06 DIAGNOSIS — E119 Type 2 diabetes mellitus without complications: Secondary | ICD-10-CM | POA: Diagnosis not present

## 2021-10-06 DIAGNOSIS — H2513 Age-related nuclear cataract, bilateral: Secondary | ICD-10-CM | POA: Diagnosis not present

## 2021-10-21 ENCOUNTER — Telehealth: Payer: Self-pay | Admitting: Family Medicine

## 2021-10-21 MED ORDER — APIXABAN 5 MG PO TABS: 5.0000 mg | ORAL_TABLET | Freq: Two times a day (BID) | ORAL | 0 refills | Status: DC

## 2021-10-21 NOTE — Telephone Encounter (Signed)
Patient's wife called to make [place] aware that the pharmacy needs a PA for eliquis. She was informed that the PA was confirmed and approved at 1:00pm, but patient states that pharmacy told her they had faxed other paper needed in order for him to get the eliquis at 2:45. Please advice.

## 2021-10-21 NOTE — Telephone Encounter (Signed)
Called the patient informed PA approved at lunch time today 10/21/21. Pharmacy/patient now aware.

## 2021-10-21 NOTE — Telephone Encounter (Signed)
Patient's wife called stating [facility] had an issue refilling his eliquis and wont be able to get it to him for another ten days. Pt would like to know if [facility] can just refill for now and send it to [facility] since patient is out of his medicine. Please advice.      Medication: apixaban (ELIQUIS) 5 MG TABS tablet  Has the patient contacted their pharmacy? Yes.   (If no, request that the patient contact the pharmacy for the refill.) (If yes, when and what did the pharmacy advise?)  Preferred Pharmacy (with phone number or street name): [id number] - [place], [place] - [address] AT [facility] [address]  [address], [place] [address]  Phone:  [phone]  Fax:  [phone]  Agent: Please be advised that RX refills may take up to 3 business days. We ask that you follow-up with your pharmacy.

## 2021-10-21 NOTE — Telephone Encounter (Signed)
Initiated PA for Eliquis on covermymeds. KEY:  [id number]  waiting response.

## 2021-10-21 NOTE — Telephone Encounter (Signed)
Sent in [place] the patient left message sent in.

## 2021-10-21 NOTE — Telephone Encounter (Signed)
Received PA Approval.

## 2021-11-05 DIAGNOSIS — M4810 Ankylosing hyperostosis [Forestier], site unspecified: Secondary | ICD-10-CM | POA: Diagnosis not present

## 2021-11-05 DIAGNOSIS — M9903 Segmental and somatic dysfunction of lumbar region: Secondary | ICD-10-CM | POA: Diagnosis not present

## 2021-11-05 DIAGNOSIS — M9904 Segmental and somatic dysfunction of sacral region: Secondary | ICD-10-CM | POA: Diagnosis not present

## 2021-11-05 DIAGNOSIS — M459 Ankylosing spondylitis of unspecified sites in spine: Secondary | ICD-10-CM | POA: Diagnosis not present

## 2021-11-18 DIAGNOSIS — M4810 Ankylosing hyperostosis [Forestier], site unspecified: Secondary | ICD-10-CM | POA: Diagnosis not present

## 2021-11-18 DIAGNOSIS — M459 Ankylosing spondylitis of unspecified sites in spine: Secondary | ICD-10-CM | POA: Diagnosis not present

## 2021-11-18 DIAGNOSIS — M9903 Segmental and somatic dysfunction of lumbar region: Secondary | ICD-10-CM | POA: Diagnosis not present

## 2021-11-18 DIAGNOSIS — M9904 Segmental and somatic dysfunction of sacral region: Secondary | ICD-10-CM | POA: Diagnosis not present

## 2021-11-19 DIAGNOSIS — G4733 Obstructive sleep apnea (adult) (pediatric): Secondary | ICD-10-CM | POA: Diagnosis not present

## 2021-12-17 ENCOUNTER — Ambulatory Visit: Payer: BC Managed Care – PPO | Admitting: Family Medicine

## 2021-12-17 ENCOUNTER — Encounter: Payer: Self-pay | Admitting: Family Medicine

## 2021-12-17 VITALS — BP 120/80 | HR 98 | Temp 98.5°F | Ht 69.0 in | Wt 190.0 lb

## 2021-12-17 DIAGNOSIS — U071 COVID-19: Secondary | ICD-10-CM | POA: Diagnosis not present

## 2021-12-17 MED ORDER — BENZONATATE 200 MG PO CAPS: 200.0000 mg | ORAL_CAPSULE | Freq: Two times a day (BID) | ORAL | 0 refills | Status: DC | PRN

## 2021-12-17 MED ORDER — FLOVENT DISKUS 100 MCG/ACT IN AEPB: 2.0000 | INHALATION_SPRAY | Freq: Two times a day (BID) | RESPIRATORY_TRACT | 0 refills | Status: DC

## 2021-12-17 NOTE — Patient Instructions (Signed)
Continue to push fluids, practice good hand hygiene, and cover your mouth if you cough. ° °If you start having fevers, shaking or shortness of breath, seek immediate care. ° °Do not drink alcohol, do any illicit/street drugs, drive or do anything that requires alertness while on this medicine.  ° °Let us know if you need anything. °

## 2021-12-17 NOTE — Progress Notes (Signed)
Chief Complaint  Patient presents with   Cough   Covid Positive    Luke Sims here for URI complaints.  Duration: 6 days  Associated symptoms: rhinorrhea, myalgia, and coughing, upset stomach, diarrhea Denies: sinus congestion, sinus pain, itchy watery eyes, ear pain, ear drainage, sore throat, wheezing, shortness of breath, and N/V, loss of taste/smell ; no longer has a fever Treatment to date: Tylenol, Coricidin D, Zinc, Vit D, pushing fluids, elderberry tea Sick contacts: No Tested + for covid on 12/17  Past Medical History:  Diagnosis Date   Atrial flutter (HCC)    Colon polyps    Depression    Diabetes mellitus without complication (HCC)    DISH (diffuse idiopathic skeletal hyperostosis)    Heart disease    Hypertension    Sleep apnea     Objective BP 120/80    Pulse 98    Temp 98.5 F (36.9 C) (Oral)    Ht 5\' 9"  (1.753 m)    Wt 190 lb (86.2 kg)    SpO2 99%    BMI 28.06 kg/m  General: Awake, alert, appears stated age HEENT: AT, [place], ears patent b/l and TM's neg, nares patent w/o discharge, pharynx pink and without exudates, MMM Neck: No masses or asymmetry Heart: RRR Lungs: CTAB, no accessory muscle use Psych: Age appropriate judgment and insight, normal mood and affect  COVID-19 - Plan: benzonatate (TESSALON) 200 MG capsule, Fluticasone Propionate, Inhal, (FLOVENT DISKUS) 100 MCG/ACT AEPB   Coughing biggest issue no. Tessalon Perles prn. If continued coughing, ICS as above. Rinse mouth out after use. Discussed quarantining rec's. Continue to push fluids, practice good hand hygiene, cover mouth when coughing. F/u prn. If starting to experience irreplaceable fluid loss, shaking, or shortness of breath, seek immediate care. Pt voiced understanding and agreement to the plan.  [place], DO 12/17/21 11:41 AM

## 2021-12-23 DIAGNOSIS — M9904 Segmental and somatic dysfunction of sacral region: Secondary | ICD-10-CM | POA: Diagnosis not present

## 2021-12-23 DIAGNOSIS — M4810 Ankylosing hyperostosis [Forestier], site unspecified: Secondary | ICD-10-CM | POA: Diagnosis not present

## 2021-12-23 DIAGNOSIS — M9903 Segmental and somatic dysfunction of lumbar region: Secondary | ICD-10-CM | POA: Diagnosis not present

## 2021-12-23 DIAGNOSIS — M459 Ankylosing spondylitis of unspecified sites in spine: Secondary | ICD-10-CM | POA: Diagnosis not present

## 2022-01-15 DIAGNOSIS — M4810 Ankylosing hyperostosis [Forestier], site unspecified: Secondary | ICD-10-CM | POA: Diagnosis not present

## 2022-01-15 DIAGNOSIS — M459 Ankylosing spondylitis of unspecified sites in spine: Secondary | ICD-10-CM | POA: Diagnosis not present

## 2022-01-15 DIAGNOSIS — M9904 Segmental and somatic dysfunction of sacral region: Secondary | ICD-10-CM | POA: Diagnosis not present

## 2022-01-15 DIAGNOSIS — M9903 Segmental and somatic dysfunction of lumbar region: Secondary | ICD-10-CM | POA: Diagnosis not present

## 2022-01-30 DIAGNOSIS — I10 Essential (primary) hypertension: Secondary | ICD-10-CM | POA: Diagnosis not present

## 2022-01-30 DIAGNOSIS — I2510 Atherosclerotic heart disease of native coronary artery without angina pectoris: Secondary | ICD-10-CM | POA: Diagnosis not present

## 2022-01-30 DIAGNOSIS — I480 Paroxysmal atrial fibrillation: Secondary | ICD-10-CM | POA: Diagnosis not present

## 2022-02-04 DIAGNOSIS — Z7984 Long term (current) use of oral hypoglycemic drugs: Secondary | ICD-10-CM | POA: Diagnosis not present

## 2022-02-04 DIAGNOSIS — E1169 Type 2 diabetes mellitus with other specified complication: Secondary | ICD-10-CM | POA: Diagnosis not present

## 2022-02-04 DIAGNOSIS — Z6838 Body mass index (BMI) 38.0-38.9, adult: Secondary | ICD-10-CM | POA: Diagnosis not present

## 2022-02-04 DIAGNOSIS — E669 Obesity, unspecified: Secondary | ICD-10-CM | POA: Diagnosis not present

## 2022-02-05 DIAGNOSIS — M4810 Ankylosing hyperostosis [Forestier], site unspecified: Secondary | ICD-10-CM | POA: Diagnosis not present

## 2022-02-05 DIAGNOSIS — M9903 Segmental and somatic dysfunction of lumbar region: Secondary | ICD-10-CM | POA: Diagnosis not present

## 2022-02-05 DIAGNOSIS — M459 Ankylosing spondylitis of unspecified sites in spine: Secondary | ICD-10-CM | POA: Diagnosis not present

## 2022-02-05 DIAGNOSIS — M9904 Segmental and somatic dysfunction of sacral region: Secondary | ICD-10-CM | POA: Diagnosis not present

## 2022-02-13 ENCOUNTER — Ambulatory Visit: Payer: BC Managed Care – PPO | Admitting: Family Medicine

## 2022-02-20 ENCOUNTER — Ambulatory Visit: Payer: BC Managed Care – PPO | Admitting: Family Medicine

## 2022-02-20 ENCOUNTER — Encounter: Payer: Self-pay | Admitting: Family Medicine

## 2022-02-20 VITALS — BP 128/84 | HR 56 | Temp 98.6°F | Ht 70.0 in | Wt 271.4 lb

## 2022-02-20 DIAGNOSIS — N401 Enlarged prostate with lower urinary tract symptoms: Secondary | ICD-10-CM | POA: Diagnosis not present

## 2022-02-20 DIAGNOSIS — R3912 Poor urinary stream: Secondary | ICD-10-CM

## 2022-02-20 DIAGNOSIS — E042 Nontoxic multinodular goiter: Secondary | ICD-10-CM

## 2022-02-20 LAB — T4, FREE: Free T4: 0.71 ng/dL (ref 0.60–1.60)

## 2022-02-20 LAB — TSH: TSH: 3.33 u[IU]/mL (ref 0.35–5.50)

## 2022-02-20 NOTE — Patient Instructions (Signed)
Give [place] 2-3 business days to get the results of your labs back.   Someone will reach out regarding your [place].   Keep the diet clean and stay active.  Let us know if you need anything.

## 2022-02-20 NOTE — Progress Notes (Signed)
Chief Complaint  Patient presents with   Follow-up    6 month Stomach tightness    Subjective Luke Sims is a 66 y.o. male who presents for BPH f/u.   Currently taking Flomax 0.4 mg daily.  Reports compliance and no adverse effects.  Nocturnal frequency and stream are both improved.  He is content with where he is at.  Patient has a history of thyroid nodules.  His last ultrasound was in 2021.  Endocrinology recommended repeat ultrasound through [place].  He has not had any weight changes, fluttering in his chest, skin changes, swelling.  He is not on any thyroid replacement medication.  He does not notice any masses in his neck.  Past Medical History:  Diagnosis Date   Atrial flutter (HCC)    Colon polyps    Depression    Diabetes mellitus without complication (HCC)    DISH (diffuse idiopathic skeletal hyperostosis)    Heart disease    Hypertension    Sleep apnea      Exam BP 128/84    Pulse (!) 56    Temp 98.6 F (37 C) (Oral)    Ht 5\' 10"  (1.778 m)    Wt 271 lb 6 oz (123.1 kg)    SpO2 99%    BMI 38.94 kg/m  General:  well developed, well nourished, in no apparent distress Lungs:  clear to auscultation, breath sounds equal bilaterally; normal effort without accessory muscle use Cardio:  regular rhythm, bradycardic Abd: BS+, S, NT, moderately distended.  Psych: well oriented with normal range of affect and appropriate judgement/insight  Assessment and Plan  Benign prostatic hyperplasia with weak urinary stream  Multiple thyroid nodules - Plan: TSH, T4, free, THYROID  Chronic, stable.  Continue Flomax 0.4 mg daily.   Chronic, hopefully stable.  Check ultrasound and thyroid labs.   Follow-up in 6 months for physical or as needed. The patient voiced understanding and agreement to the plan.  [place], DO 02/20/22 8:34 AM

## 2022-02-24 DIAGNOSIS — M4810 Ankylosing hyperostosis [Forestier], site unspecified: Secondary | ICD-10-CM | POA: Diagnosis not present

## 2022-02-24 DIAGNOSIS — M9903 Segmental and somatic dysfunction of lumbar region: Secondary | ICD-10-CM | POA: Diagnosis not present

## 2022-02-24 DIAGNOSIS — M9904 Segmental and somatic dysfunction of sacral region: Secondary | ICD-10-CM | POA: Diagnosis not present

## 2022-02-24 DIAGNOSIS — M459 Ankylosing spondylitis of unspecified sites in spine: Secondary | ICD-10-CM | POA: Diagnosis not present

## 2022-02-26 ENCOUNTER — Telehealth (HOSPITAL_BASED_OUTPATIENT_CLINIC_OR_DEPARTMENT_OTHER): Payer: Self-pay

## 2022-03-09 ENCOUNTER — Telehealth (HOSPITAL_BASED_OUTPATIENT_CLINIC_OR_DEPARTMENT_OTHER): Payer: Self-pay

## 2022-03-12 DIAGNOSIS — M9903 Segmental and somatic dysfunction of lumbar region: Secondary | ICD-10-CM | POA: Diagnosis not present

## 2022-03-12 DIAGNOSIS — M4810 Ankylosing hyperostosis [Forestier], site unspecified: Secondary | ICD-10-CM | POA: Diagnosis not present

## 2022-03-12 DIAGNOSIS — M459 Ankylosing spondylitis of unspecified sites in spine: Secondary | ICD-10-CM | POA: Diagnosis not present

## 2022-03-12 DIAGNOSIS — M9904 Segmental and somatic dysfunction of sacral region: Secondary | ICD-10-CM | POA: Diagnosis not present

## 2022-04-06 ENCOUNTER — Telehealth (HOSPITAL_BASED_OUTPATIENT_CLINIC_OR_DEPARTMENT_OTHER): Payer: Self-pay

## 2022-04-07 DIAGNOSIS — M9903 Segmental and somatic dysfunction of lumbar region: Secondary | ICD-10-CM | POA: Diagnosis not present

## 2022-04-07 DIAGNOSIS — M459 Ankylosing spondylitis of unspecified sites in spine: Secondary | ICD-10-CM | POA: Diagnosis not present

## 2022-04-07 DIAGNOSIS — M9904 Segmental and somatic dysfunction of sacral region: Secondary | ICD-10-CM | POA: Diagnosis not present

## 2022-04-07 DIAGNOSIS — M4810 Ankylosing hyperostosis [Forestier], site unspecified: Secondary | ICD-10-CM | POA: Diagnosis not present

## 2022-04-10 DIAGNOSIS — M459 Ankylosing spondylitis of unspecified sites in spine: Secondary | ICD-10-CM | POA: Diagnosis not present

## 2022-04-10 DIAGNOSIS — M9904 Segmental and somatic dysfunction of sacral region: Secondary | ICD-10-CM | POA: Diagnosis not present

## 2022-04-10 DIAGNOSIS — M4810 Ankylosing hyperostosis [Forestier], site unspecified: Secondary | ICD-10-CM | POA: Diagnosis not present

## 2022-04-10 DIAGNOSIS — M9903 Segmental and somatic dysfunction of lumbar region: Secondary | ICD-10-CM | POA: Diagnosis not present

## 2022-05-05 ENCOUNTER — Telehealth: Payer: Self-pay | Admitting: Family Medicine

## 2022-05-05 MED ORDER — AMLODIPINE BESYLATE 5 MG PO TABS: 5.0000 mg | ORAL_TABLET | Freq: Every day | ORAL | 0 refills | Status: DC

## 2022-05-05 NOTE — Telephone Encounter (Signed)
Sent in Patient informed 

## 2022-05-05 NOTE — Telephone Encounter (Signed)
Pt states [facility] lost his package for amlodipine and they won't send anymore until the end of the month. He is hoping a 30 day supply can be sent to a local pharmacy.  ? ? ? ? ?[id number] - [place], [place] - [address] AT [facility] OF [address]  ?[address], [place] [address]  ?Phone:  [phone]  Fax:  [phone]  ?

## 2022-05-06 MED ORDER — AMLODIPINE BESYLATE 5 MG PO TABS: 5.0000 mg | ORAL_TABLET | Freq: Every day | ORAL | 0 refills | Status: DC

## 2022-05-06 NOTE — Addendum Note (Signed)
Addended by: Scharlene Gloss B on: 05/06/2022 10:22 AM ? ? Modules accepted: Orders ? ?

## 2022-05-19 DIAGNOSIS — M459 Ankylosing spondylitis of unspecified sites in spine: Secondary | ICD-10-CM | POA: Diagnosis not present

## 2022-05-19 DIAGNOSIS — M4810 Ankylosing hyperostosis [Forestier], site unspecified: Secondary | ICD-10-CM | POA: Diagnosis not present

## 2022-05-19 DIAGNOSIS — M9904 Segmental and somatic dysfunction of sacral region: Secondary | ICD-10-CM | POA: Diagnosis not present

## 2022-05-19 DIAGNOSIS — M9903 Segmental and somatic dysfunction of lumbar region: Secondary | ICD-10-CM | POA: Diagnosis not present

## 2022-06-04 DIAGNOSIS — M459 Ankylosing spondylitis of unspecified sites in spine: Secondary | ICD-10-CM | POA: Diagnosis not present

## 2022-06-04 DIAGNOSIS — M4810 Ankylosing hyperostosis [Forestier], site unspecified: Secondary | ICD-10-CM | POA: Diagnosis not present

## 2022-06-04 DIAGNOSIS — M9903 Segmental and somatic dysfunction of lumbar region: Secondary | ICD-10-CM | POA: Diagnosis not present

## 2022-06-04 DIAGNOSIS — M9904 Segmental and somatic dysfunction of sacral region: Secondary | ICD-10-CM | POA: Diagnosis not present

## 2022-06-19 DIAGNOSIS — M459 Ankylosing spondylitis of unspecified sites in spine: Secondary | ICD-10-CM | POA: Diagnosis not present

## 2022-06-19 DIAGNOSIS — M9904 Segmental and somatic dysfunction of sacral region: Secondary | ICD-10-CM | POA: Diagnosis not present

## 2022-06-19 DIAGNOSIS — M4810 Ankylosing hyperostosis [Forestier], site unspecified: Secondary | ICD-10-CM | POA: Diagnosis not present

## 2022-06-19 DIAGNOSIS — M9903 Segmental and somatic dysfunction of lumbar region: Secondary | ICD-10-CM | POA: Diagnosis not present

## 2022-07-29 ENCOUNTER — Encounter: Payer: Self-pay | Admitting: Family Medicine

## 2022-07-29 DIAGNOSIS — N401 Enlarged prostate with lower urinary tract symptoms: Secondary | ICD-10-CM

## 2022-07-29 MED ORDER — TAMSULOSIN HCL 0.4 MG PO CAPS: ORAL_CAPSULE | ORAL | 2 refills | Status: DC

## 2022-08-05 DIAGNOSIS — E1129 Type 2 diabetes mellitus with other diabetic kidney complication: Secondary | ICD-10-CM | POA: Diagnosis not present

## 2022-08-05 DIAGNOSIS — R809 Proteinuria, unspecified: Secondary | ICD-10-CM | POA: Diagnosis not present

## 2022-08-05 DIAGNOSIS — E041 Nontoxic single thyroid nodule: Secondary | ICD-10-CM | POA: Diagnosis not present

## 2022-08-05 DIAGNOSIS — E669 Obesity, unspecified: Secondary | ICD-10-CM | POA: Diagnosis not present

## 2022-08-16 IMAGING — CR DG LUMBAR SPINE COMPLETE 4+V
5 series · 5 of 5 positions shown · non-contrast
Comparison: None.

CLINICAL DATA: Pain after fall

EXAM:
LUMBAR SPINE - COMPLETE 4+ VIEW

[w lumbar spine ap]
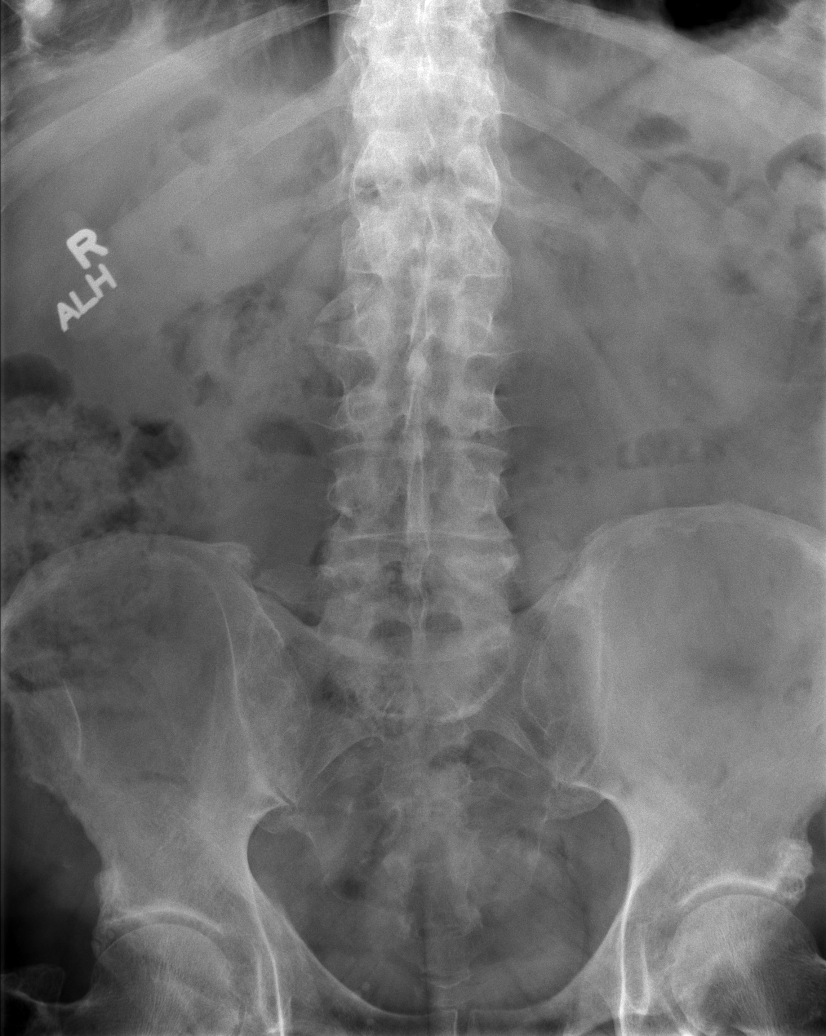

[w lumbar spine obl (1 of 2)]
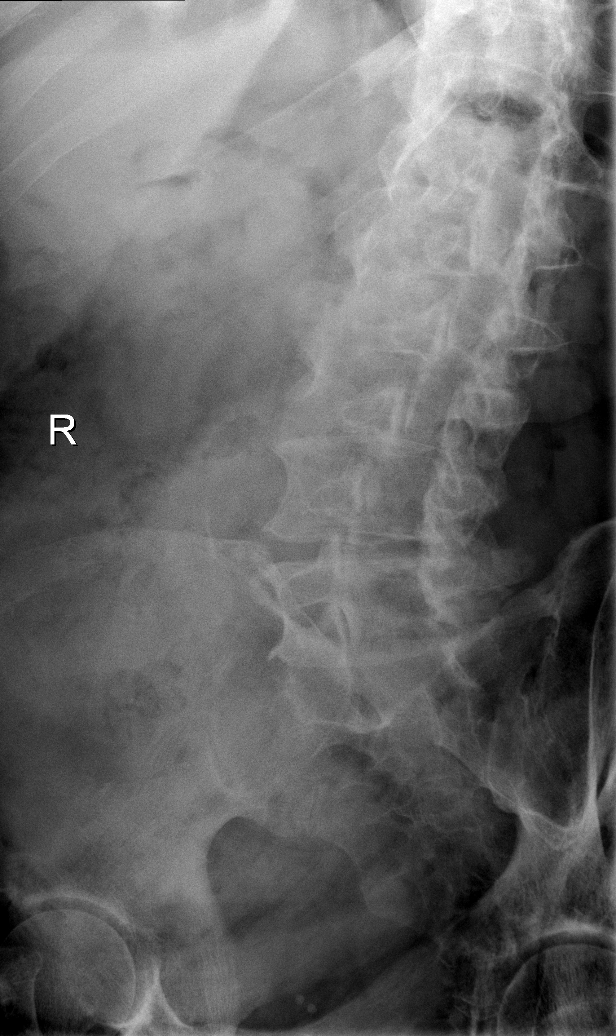

[w lumbar spine obl (2 of 2)]
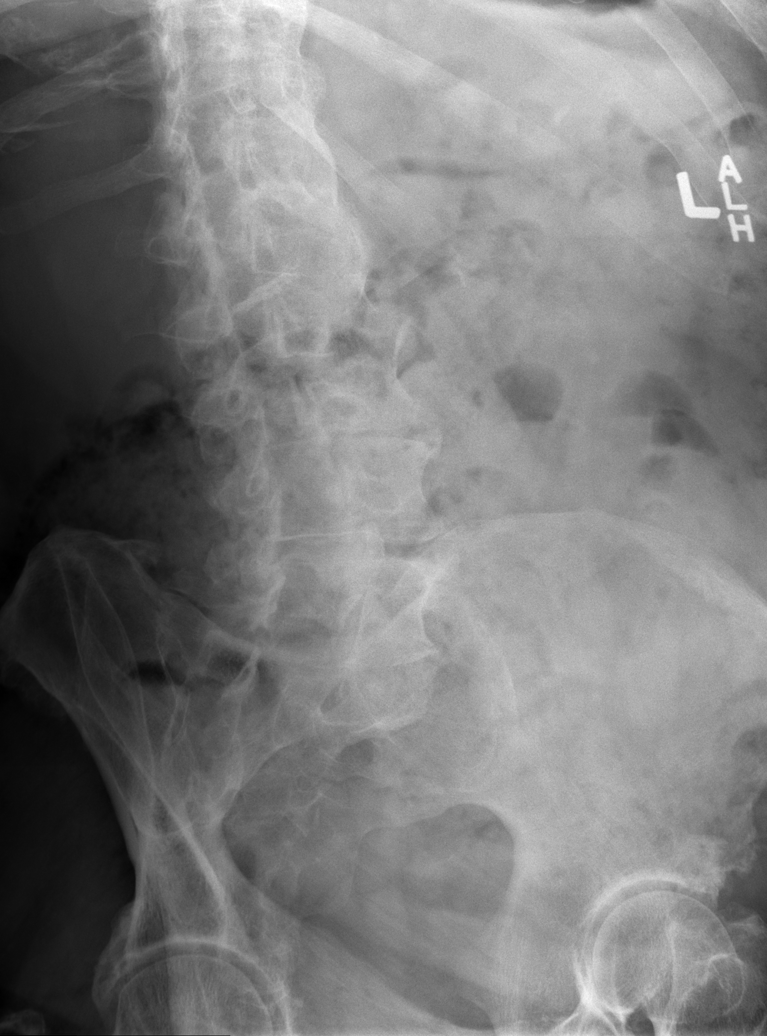

[w lumbar spine lat]
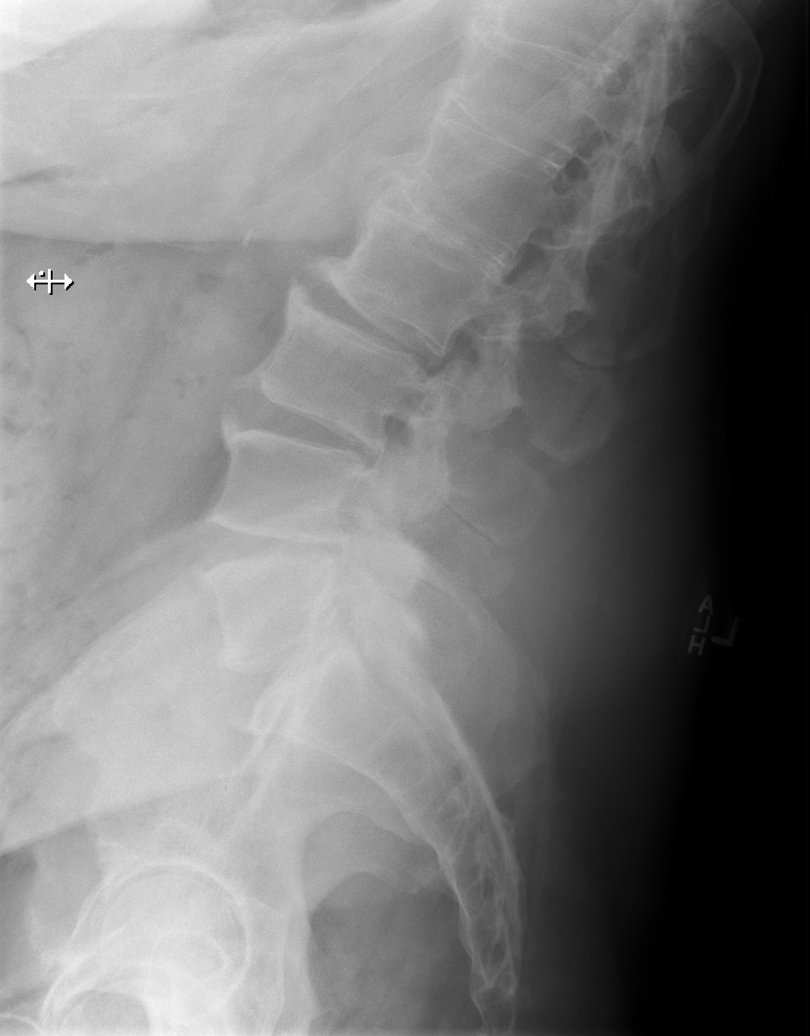

[w lumbar l-5 s-1 spot]
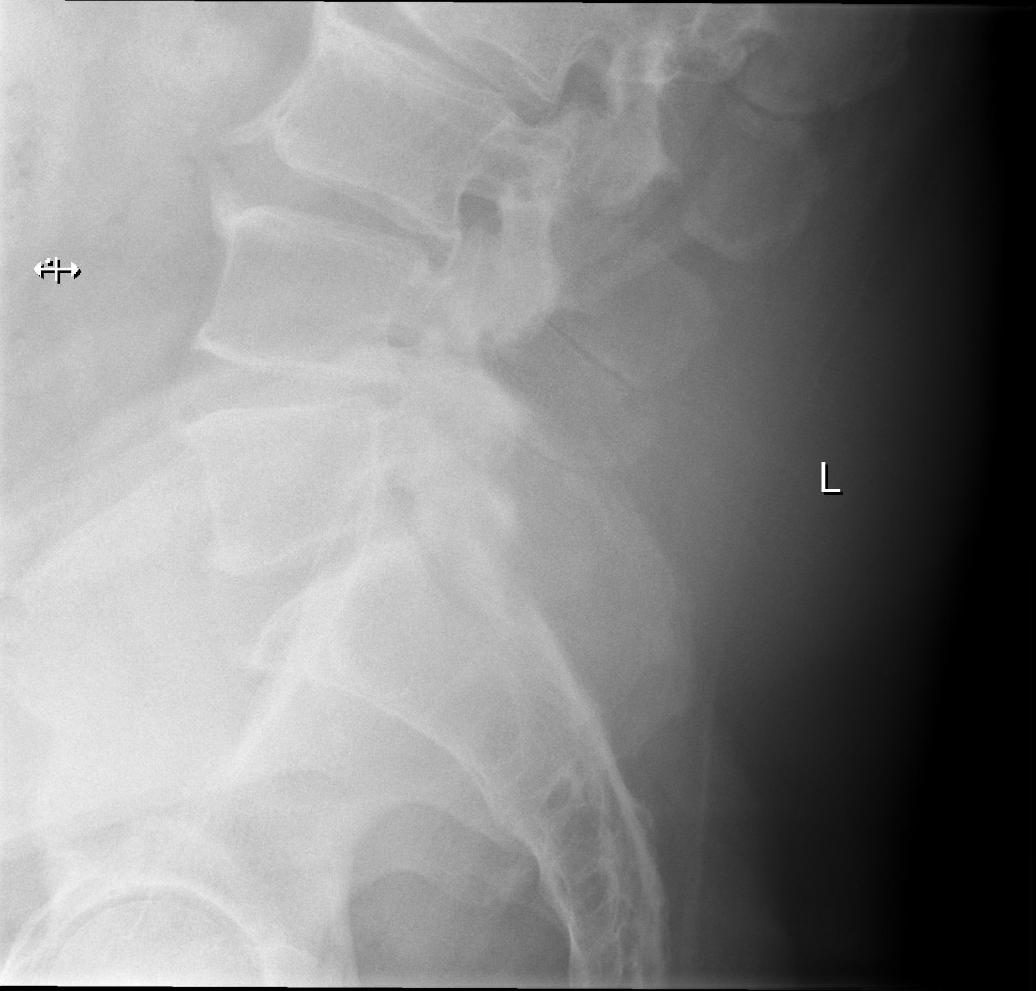

[5 of 5 positions shown; findings below may reference images not displayed]

FINDINGS: Multilevel degenerative disc disease. Lower lumbar facet
degenerative changes. No fracture or traumatic malalignment.
IMPRESSION: Degenerative changes.  No other abnormalities.

## 2022-08-16 IMAGING — CR DG THORACIC SPINE 3V
4 series · 4 of 4 positions shown · non-contrast
Comparison: None.

CLINICAL DATA: Pain after fall.

EXAM:
THORACIC SPINE - 3 VIEWS

[w thoracic spine ap]
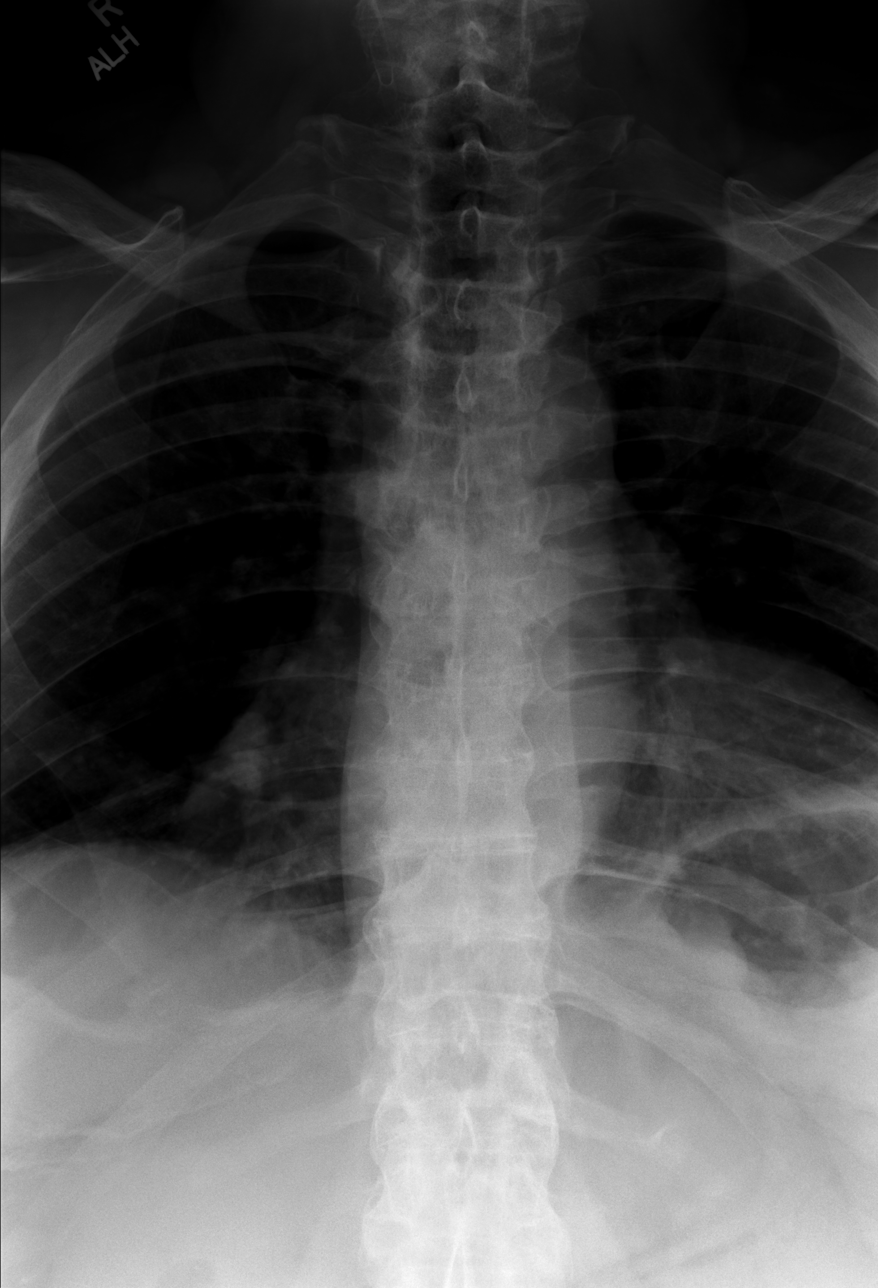

[w thoracic spine lat (1 of 2)]
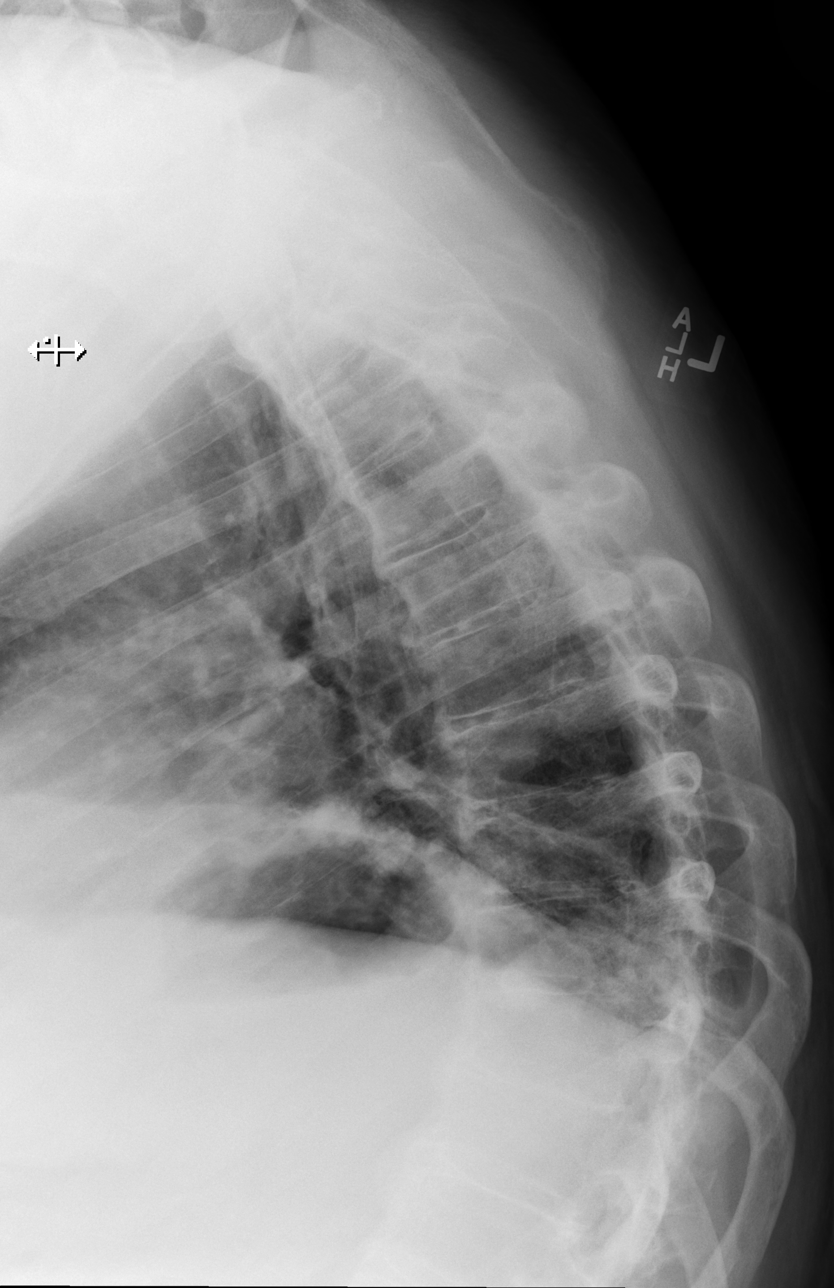

[w thoracic spine lat (2 of 2)]
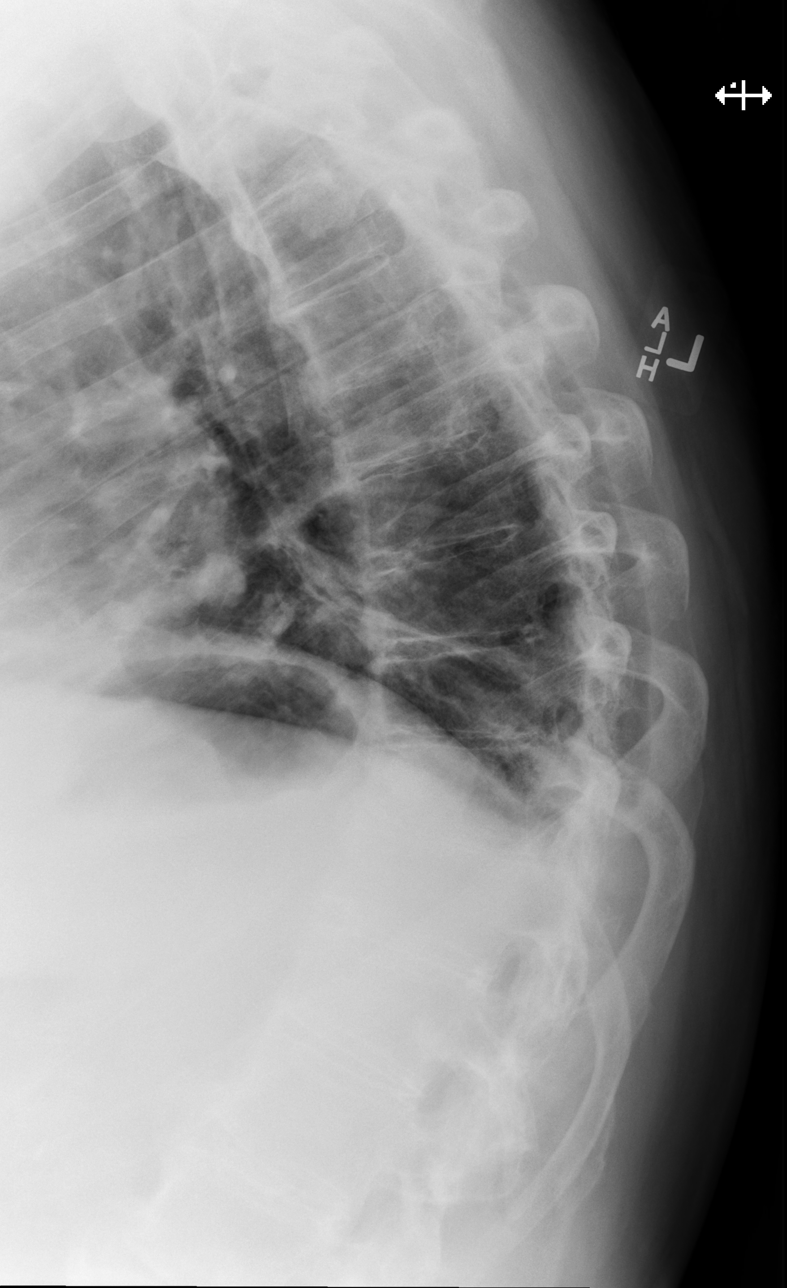

[w thoracic swimmers]
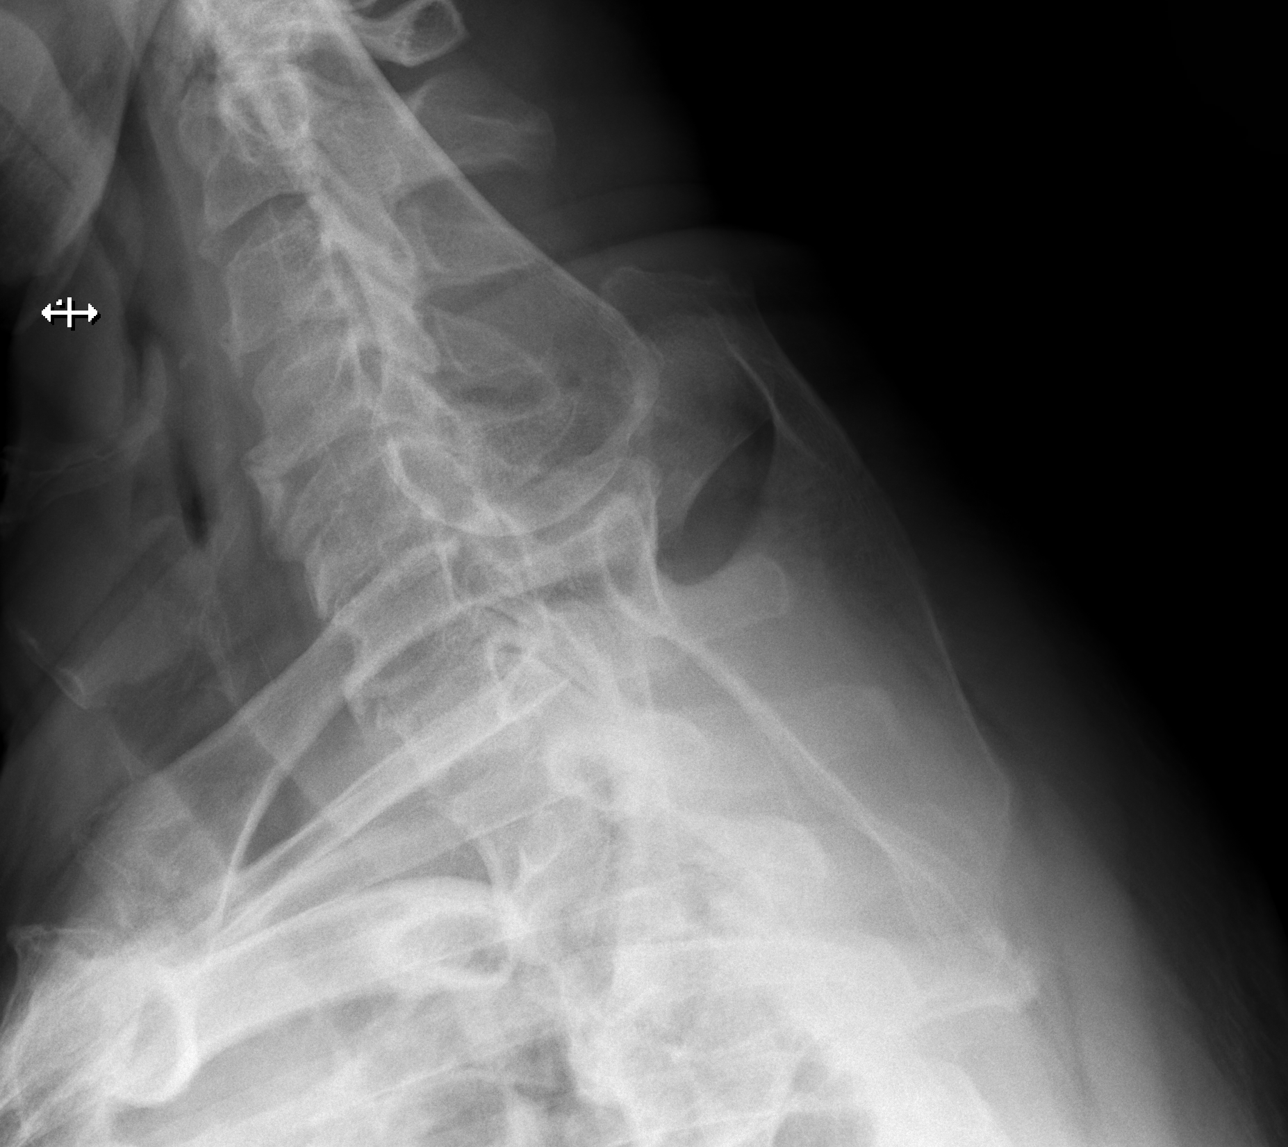

[4 of 4 positions shown; findings below may reference images not displayed]

FINDINGS: No fracture or traumatic malalignment. Degenerative disc disease
with anterior osteophytes. Mild wedging of lower thoracic vertebral
bodies, unchanged since March 27, 2018. Degenerative changes in the
cervical spine. No other abnormalities.
IMPRESSION: Degenerative changes.  No acute abnormalities.

## 2022-08-18 ENCOUNTER — Other Ambulatory Visit: Payer: Self-pay | Admitting: Family Medicine

## 2022-08-18 MED ORDER — AMLODIPINE BESYLATE 5 MG PO TABS: 5.0000 mg | ORAL_TABLET | Freq: Every day | ORAL | 0 refills | Status: DC

## 2022-08-20 DIAGNOSIS — M9904 Segmental and somatic dysfunction of sacral region: Secondary | ICD-10-CM | POA: Diagnosis not present

## 2022-08-20 DIAGNOSIS — M9903 Segmental and somatic dysfunction of lumbar region: Secondary | ICD-10-CM | POA: Diagnosis not present

## 2022-08-20 DIAGNOSIS — M459 Ankylosing spondylitis of unspecified sites in spine: Secondary | ICD-10-CM | POA: Diagnosis not present

## 2022-08-20 DIAGNOSIS — M4810 Ankylosing hyperostosis [Forestier], site unspecified: Secondary | ICD-10-CM | POA: Diagnosis not present

## 2022-08-21 ENCOUNTER — Encounter: Payer: Self-pay | Admitting: Family Medicine

## 2022-08-21 ENCOUNTER — Telehealth: Payer: Self-pay | Admitting: Family Medicine

## 2022-08-21 ENCOUNTER — Ambulatory Visit (INDEPENDENT_AMBULATORY_CARE_PROVIDER_SITE_OTHER): Payer: BC Managed Care – PPO | Admitting: Family Medicine

## 2022-08-21 ENCOUNTER — Encounter: Payer: BC Managed Care – PPO | Admitting: Family Medicine

## 2022-08-21 ENCOUNTER — Other Ambulatory Visit: Payer: Self-pay | Admitting: Family Medicine

## 2022-08-21 VITALS — BP 138/78 | HR 46 | Temp 98.2°F | Ht 69.0 in | Wt 261.4 lb

## 2022-08-21 DIAGNOSIS — Z125 Encounter for screening for malignant neoplasm of prostate: Secondary | ICD-10-CM | POA: Diagnosis not present

## 2022-08-21 DIAGNOSIS — E1169 Type 2 diabetes mellitus with other specified complication: Secondary | ICD-10-CM | POA: Diagnosis not present

## 2022-08-21 DIAGNOSIS — E669 Obesity, unspecified: Secondary | ICD-10-CM | POA: Diagnosis not present

## 2022-08-21 DIAGNOSIS — Z0000 Encounter for general adult medical examination without abnormal findings: Secondary | ICD-10-CM | POA: Diagnosis not present

## 2022-08-21 DIAGNOSIS — R972 Elevated prostate specific antigen [PSA]: Secondary | ICD-10-CM

## 2022-08-21 DIAGNOSIS — Z7184 Encounter for health counseling related to travel: Secondary | ICD-10-CM | POA: Diagnosis not present

## 2022-08-21 LAB — CBC
HCT: 44.7 % (ref 39.0–52.0)
Hemoglobin: 15.3 g/dL (ref 13.0–17.0)
MCHC: 34.3 g/dL (ref 30.0–36.0)
MCV: 87.3 fl (ref 78.0–100.0)
Platelets: 182 10*3/uL (ref 150.0–400.0)
RBC: 5.12 Mil/uL (ref 4.22–5.81)
RDW: 13.9 % (ref 11.5–15.5)
WBC: 4.2 10*3/uL (ref 4.0–10.5)

## 2022-08-21 LAB — COMPREHENSIVE METABOLIC PANEL
ALT: 22 U/L (ref 0–53)
AST: 19 U/L (ref 0–37)
Albumin: 4.6 g/dL (ref 3.5–5.2)
Alkaline Phosphatase: 44 U/L (ref 39–117)
BUN: 14 mg/dL (ref 6–23)
CO2: 29 mEq/L (ref 19–32)
Calcium: 9.9 mg/dL (ref 8.4–10.5)
Chloride: 102 mEq/L (ref 96–112)
Creatinine, Ser: 0.93 mg/dL (ref 0.40–1.50)
GFR: 86.82 mL/min (ref >60.00)
Glucose, Bld: 103 mg/dL — ABNORMAL HIGH (ref 70–99)
Potassium: 3.8 mEq/L (ref 3.5–5.1)
Sodium: 140 mEq/L (ref 135–145)
Total Bilirubin: 1 mg/dL (ref 0.2–1.2)
Total Protein: 6.9 g/dL (ref 6.0–8.3)

## 2022-08-21 LAB — LIPID PANEL
Cholesterol: 95 mg/dL (ref 0–200)
HDL: 33.4 mg/dL — ABNORMAL LOW (ref >39.00)
LDL Cholesterol: 36 mg/dL (ref 0–99)
NonHDL: 61.83
Total CHOL/HDL Ratio: 3
Triglycerides: 130 mg/dL (ref 0.0–149.0)
VLDL: 26 mg/dL (ref 0.0–40.0)

## 2022-08-21 LAB — MICROALBUMIN / CREATININE URINE RATIO
Creatinine,U: 51.2 mg/dL
Microalb Creat Ratio: 3.8 mg/g (ref 0.0–30.0)
Microalb, Ur: 1.9 mg/dL (ref 0.0–1.9)

## 2022-08-21 LAB — PSA: PSA: 2.25 ng/mL (ref 0.10–4.00)

## 2022-08-21 LAB — HEMOGLOBIN A1C: Hgb A1c MFr Bld: 6.6 % — ABNORMAL HIGH (ref 4.6–6.5)

## 2022-08-21 MED ORDER — ATOVAQUONE-PROGUANIL HCL 250-100 MG PO TABS: ORAL_TABLET | ORAL | 0 refills | Status: DC

## 2022-08-21 MED ORDER — METFORMIN HCL ER 500 MG PO TB24: 500.0000 mg | ORAL_TABLET | Freq: Two times a day (BID) | ORAL | 3 refills | Status: DC

## 2022-08-21 MED ORDER — AZITHROMYCIN 500 MG PO TABS: ORAL_TABLET | ORAL | 0 refills | Status: DC

## 2022-08-21 NOTE — Telephone Encounter (Signed)
PCP will review labs from today and let him know instructions

## 2022-08-21 NOTE — Progress Notes (Signed)
Chief Complaint  Patient presents with   Annual Exam    Well Male Luke Sims is here for a complete physical.   His last physical was >1 year ago.  Current diet: in general, a "healthy" diet. IF.  Current exercise: walking Weight trend: starting to lose wt Fatigue out of ordinary? No. Seat belt? Yes.   Advanced directive? Yes  Health maintenance Shingrix- No Colonoscopy- Yes Tetanus- Yes HIV- Yes Hep C- Yes  Travel Patient is going to [place] on November 28 and returning on December 10.  He would like a prescription for potential traveler's diarrhea in addition to malaria prophylaxis if he needs it.  He is up-to-date with his regular vaccinations with exception of the flu shot.  He has an appointment with the travel clinic at his workplace coming up next week.   Past Medical History:  Diagnosis Date   Atrial flutter (HCC)    Colon polyps    Depression    Diabetes mellitus without complication (HCC)    DISH (diffuse idiopathic skeletal hyperostosis)    Heart disease    Hypertension    Sleep apnea       Past Surgical History:  Procedure Laterality Date   COLONOSCOPY      Medications  Current Outpatient Medications on File Prior to Visit  Medication Sig Dispense Refill   amLODipine (NORVASC) 5 MG tablet Take 1 tablet (5 mg total) by mouth daily. 90 tablet 0   apixaban (ELIQUIS) 5 MG TABS tablet Take 1 tablet (5 mg total) by mouth 2 (two) times daily. 60 tablet 0   aspirin EC 81 MG tablet Take 81 mg by mouth daily.     empagliflozin (JARDIANCE) 10 MG TABS tablet Take 10 mg by mouth daily.     Fluticasone Propionate, Inhal, (FLOVENT DISKUS) 100 MCG/ACT AEPB Inhale 2 puffs into the lungs in the morning and at bedtime. 60 each 0   metFORMIN (GLUCOPHAGE-XR) 500 MG 24 hr tablet Take 2 tablets (1,000 mg total) by mouth 2 (two) times a week. 360 tablet 3   OZEMPIC, 1 MG/DOSE, 4 MG/3ML SOPN Inject 2 mg into the skin once a week.     rosuvastatin (CRESTOR) 20 MG tablet Take  20 mg by mouth daily.     tamsulosin (FLOMAX) 0.4 MG CAPS capsule TAKE 1 CAPSULE(0.4 MG) BY MOUTH DAILY 90 capsule 2   valsartan-hydrochlorothiazide (DIOVAN-HCT) 320-25 MG tablet Take 1 tablet by mouth daily. 20 tablet 0   Allergies Allergies  Allergen Reactions   Ace Inhibitors Cough and Other (See Comments)    Cough     Family History Family History  Problem Relation Age of Onset   Diabetes Mother    Heart disease Father    Hyperlipidemia Father    Diabetes Sister    COPD Brother    Hypertension Son    Arthritis Sister    Arthritis Sister     Review of Systems: Constitutional:  no fevers Eye:  no recent significant change in vision Ear/Nose/Mouth/Throat:  Ears:  no hearing loss Nose/Mouth/Throat:  no complaints of nasal congestion, no sore throat Cardiovascular:  no chest pain Respiratory:  no shortness of breath Gastrointestinal:  no change in bowel habits GU:  Male: negative for dysuria, frequency Musculoskeletal/Extremities:  no joint pain Integumentary (Skin/Breast):  no abnormal skin lesions reported Neurologic:  no headaches Endocrine: No unexpected weight changes Hematologic/Lymphatic:  no abnormal bleeding  Exam BP 138/78   Pulse (!) 46   Temp 98.2 F (36.8  C) (Oral)   Ht 5\' 9"  (1.753 m)   Wt 261 lb 6 oz (118.6 kg)   SpO2 96%   BMI 38.60 kg/m  General:  well developed, well nourished, in no apparent distress Skin:  no significant moles, warts, or growths Head:  no masses, lesions, or tenderness Eyes:  pupils equal and round, sclera anicteric without injection Ears:  canals without lesions, TMs shiny without retraction, no obvious effusion, no erythema Nose:  nares patent, septum midline, mucosa normal Throat/Pharynx:  lips and gingiva without lesion; tongue and uvula midline; non-inflamed pharynx; no exudates or postnasal drainage Neck: neck supple without adenopathy, thyromegaly, or masses Cardiac: RRR, no bruits, no LE edema Lungs:  clear to  auscultation, breath sounds equal bilaterally, no respiratory distress Abdomen: BS+, soft, non-tender, non-distended, no masses or organomegaly noted Rectal: Deferred Musculoskeletal:  symmetrical muscle groups noted without atrophy or deformity Neuro:  gait normal; deep tendon reflexes normal and symmetric Psych: well oriented with normal range of affect and appropriate judgment/insight  Assessment and Plan  Well adult exam - Plan: Comprehensive metabolic panel, Lipid panel, CBC  Travel advice encounter - Plan: azithromycin (ZITHROMAX) 500 MG tablet  Diabetes mellitus type 2 in obese (HCC) - Plan: Hemoglobin A1c  Screening for prostate cancer - Plan: PSA   Well 66 y.o. male. Counseled on diet and exercise. Counseled on risks and benefits of prostate cancer screening with PSA. The patient agrees to undergo testing. Immunizations, labs, and further orders as above. Advanced directive form requested today.  Travel advice: From 11/28-12/10 will be in [place].  He will start Malarone 2 days prior to leaving and continue for 7 days after returning.  3 days of azithromycin 500 mg daily sent in should he experience traveler's diarrhea or pneumonia.  Encouraged to keep appointment with travel clinic next week. Follow up in 6 mo. he would like to recheck his A1c in 90 days. Order placed.  The patient voiced understanding and agreement to the plan.  [place], DO 08/21/22 8:29 AM

## 2022-08-21 NOTE — Patient Instructions (Addendum)
Give us 2-3 business days to get the results of your labs back.   Keep the diet clean and stay active.  I recommend getting the flu shot in mid October. This suggestion would change if the CDC comes out with a different recommendation.   Please get me a copy of your advanced directive form at your convenience.   Let us know if you need anything.  

## 2022-08-21 NOTE — Telephone Encounter (Signed)
Pt is wanting to get his a1c check in 3 moths.e advise pt once orders are in so he can be scheduled.

## 2022-09-25 ENCOUNTER — Telehealth: Payer: Self-pay | Admitting: *Deleted

## 2022-09-25 NOTE — Addendum Note (Signed)
Addended by: Kelle Darting A on: 09/25/2022 03:18 PM   Modules accepted: Orders

## 2022-09-25 NOTE — Telephone Encounter (Signed)
Pt has lab appt on 10/02/22 for PSA.  There is a future order for a1c that was placed on 8/25. Pt also had an a1c checked on 8/25. Please advise if we should do a1c with psa on 10/6 or are we doing psa only?

## 2022-09-25 NOTE — Telephone Encounter (Signed)
No, just the PSA on that date. Ty.

## 2022-09-25 NOTE — Telephone Encounter (Signed)
Thank you. Lab orders updated.

## 2022-10-01 DIAGNOSIS — M9903 Segmental and somatic dysfunction of lumbar region: Secondary | ICD-10-CM | POA: Diagnosis not present

## 2022-10-01 DIAGNOSIS — M459 Ankylosing spondylitis of unspecified sites in spine: Secondary | ICD-10-CM | POA: Diagnosis not present

## 2022-10-01 DIAGNOSIS — M4810 Ankylosing hyperostosis [Forestier], site unspecified: Secondary | ICD-10-CM | POA: Diagnosis not present

## 2022-10-01 DIAGNOSIS — M9904 Segmental and somatic dysfunction of sacral region: Secondary | ICD-10-CM | POA: Diagnosis not present

## 2022-10-02 ENCOUNTER — Other Ambulatory Visit (INDEPENDENT_AMBULATORY_CARE_PROVIDER_SITE_OTHER): Payer: BC Managed Care – PPO

## 2022-10-02 DIAGNOSIS — R972 Elevated prostate specific antigen [PSA]: Secondary | ICD-10-CM

## 2022-10-02 LAB — PSA: PSA: 2.05 ng/mL (ref 0.10–4.00)

## 2022-10-12 DIAGNOSIS — E119 Type 2 diabetes mellitus without complications: Secondary | ICD-10-CM | POA: Diagnosis not present

## 2022-10-12 DIAGNOSIS — H43812 Vitreous degeneration, left eye: Secondary | ICD-10-CM | POA: Diagnosis not present

## 2022-10-12 DIAGNOSIS — H2513 Age-related nuclear cataract, bilateral: Secondary | ICD-10-CM | POA: Diagnosis not present

## 2022-10-12 DIAGNOSIS — Z7984 Long term (current) use of oral hypoglycemic drugs: Secondary | ICD-10-CM | POA: Diagnosis not present

## 2022-10-20 DIAGNOSIS — M9904 Segmental and somatic dysfunction of sacral region: Secondary | ICD-10-CM | POA: Diagnosis not present

## 2022-10-20 DIAGNOSIS — M9903 Segmental and somatic dysfunction of lumbar region: Secondary | ICD-10-CM | POA: Diagnosis not present

## 2022-10-20 DIAGNOSIS — M4810 Ankylosing hyperostosis [Forestier], site unspecified: Secondary | ICD-10-CM | POA: Diagnosis not present

## 2022-10-20 DIAGNOSIS — M459 Ankylosing spondylitis of unspecified sites in spine: Secondary | ICD-10-CM | POA: Diagnosis not present

## 2022-11-06 DIAGNOSIS — H903 Sensorineural hearing loss, bilateral: Secondary | ICD-10-CM | POA: Diagnosis not present

## 2022-11-07 ENCOUNTER — Other Ambulatory Visit: Payer: Self-pay | Admitting: Family Medicine

## 2022-11-10 DIAGNOSIS — M9904 Segmental and somatic dysfunction of sacral region: Secondary | ICD-10-CM | POA: Diagnosis not present

## 2022-11-10 DIAGNOSIS — M9903 Segmental and somatic dysfunction of lumbar region: Secondary | ICD-10-CM | POA: Diagnosis not present

## 2022-11-10 DIAGNOSIS — M459 Ankylosing spondylitis of unspecified sites in spine: Secondary | ICD-10-CM | POA: Diagnosis not present

## 2022-11-10 DIAGNOSIS — M4810 Ankylosing hyperostosis [Forestier], site unspecified: Secondary | ICD-10-CM | POA: Diagnosis not present

## 2022-11-13 DIAGNOSIS — H8101 Meniere's disease, right ear: Secondary | ICD-10-CM | POA: Diagnosis not present

## 2022-11-13 DIAGNOSIS — H90A21 Sensorineural hearing loss, unilateral, right ear, with restricted hearing on the contralateral side: Secondary | ICD-10-CM | POA: Diagnosis not present

## 2022-11-13 DIAGNOSIS — E669 Obesity, unspecified: Secondary | ICD-10-CM | POA: Diagnosis not present

## 2022-11-13 DIAGNOSIS — H903 Sensorineural hearing loss, bilateral: Secondary | ICD-10-CM | POA: Diagnosis not present

## 2022-12-10 DIAGNOSIS — M459 Ankylosing spondylitis of unspecified sites in spine: Secondary | ICD-10-CM | POA: Diagnosis not present

## 2022-12-10 DIAGNOSIS — M9904 Segmental and somatic dysfunction of sacral region: Secondary | ICD-10-CM | POA: Diagnosis not present

## 2022-12-10 DIAGNOSIS — M9903 Segmental and somatic dysfunction of lumbar region: Secondary | ICD-10-CM | POA: Diagnosis not present

## 2022-12-10 DIAGNOSIS — M4810 Ankylosing hyperostosis [Forestier], site unspecified: Secondary | ICD-10-CM | POA: Diagnosis not present

## 2023-02-04 DIAGNOSIS — H8101 Meniere's disease, right ear: Secondary | ICD-10-CM | POA: Diagnosis not present

## 2023-02-04 DIAGNOSIS — H9311 Tinnitus, right ear: Secondary | ICD-10-CM | POA: Diagnosis not present

## 2023-02-04 DIAGNOSIS — H903 Sensorineural hearing loss, bilateral: Secondary | ICD-10-CM | POA: Diagnosis not present

## 2023-02-04 DIAGNOSIS — Z45321 Encounter for adjustment and management of cochlear device: Secondary | ICD-10-CM | POA: Diagnosis not present

## 2023-02-19 ENCOUNTER — Ambulatory Visit: Payer: BC Managed Care – PPO | Admitting: Family Medicine

## 2023-02-19 ENCOUNTER — Encounter: Payer: Self-pay | Admitting: Family Medicine

## 2023-02-19 VITALS — BP 118/72 | HR 55 | Temp 98.0°F | Ht 69.0 in | Wt 262.0 lb

## 2023-02-19 DIAGNOSIS — M7501 Adhesive capsulitis of right shoulder: Secondary | ICD-10-CM

## 2023-02-19 DIAGNOSIS — M7502 Adhesive capsulitis of left shoulder: Secondary | ICD-10-CM

## 2023-02-19 DIAGNOSIS — E1159 Type 2 diabetes mellitus with other circulatory complications: Secondary | ICD-10-CM

## 2023-02-19 DIAGNOSIS — I10 Essential (primary) hypertension: Secondary | ICD-10-CM

## 2023-02-19 LAB — LIPID PANEL
Cholesterol: 102 mg/dL (ref 0–200)
HDL: 34 mg/dL — ABNORMAL LOW (ref >39.00)
LDL Cholesterol: 40 mg/dL (ref 0–99)
NonHDL: 67.75
Total CHOL/HDL Ratio: 3
Triglycerides: 139 mg/dL (ref 0.0–149.0)
VLDL: 27.8 mg/dL (ref 0.0–40.0)

## 2023-02-19 LAB — COMPREHENSIVE METABOLIC PANEL
ALT: 18 U/L (ref 0–53)
AST: 15 U/L (ref 0–37)
Albumin: 4.8 g/dL (ref 3.5–5.2)
Alkaline Phosphatase: 49 U/L (ref 39–117)
BUN: 20 mg/dL (ref 6–23)
CO2: 32 mEq/L (ref 19–32)
Calcium: 10.7 mg/dL — ABNORMAL HIGH (ref 8.4–10.5)
Chloride: 100 mEq/L (ref 96–112)
Creatinine, Ser: 0.94 mg/dL (ref 0.40–1.50)
GFR: 85.41 mL/min (ref >60.00)
Glucose, Bld: 131 mg/dL — ABNORMAL HIGH (ref 70–99)
Potassium: 4.1 mEq/L (ref 3.5–5.1)
Sodium: 141 mEq/L (ref 135–145)
Total Bilirubin: 0.9 mg/dL (ref 0.2–1.2)
Total Protein: 7.1 g/dL (ref 6.0–8.3)

## 2023-02-19 LAB — HEMOGLOBIN A1C: Hgb A1c MFr Bld: 8 % — ABNORMAL HIGH (ref 4.6–6.5)

## 2023-02-19 NOTE — Patient Instructions (Addendum)
Give [place] 2-3 business days to get the results of your labs back.   Keep the diet clean and stay active.  Heat (pad or rice pillow in microwave) over affected area, 10-15 minutes twice daily.   Ice/cold pack over area for 10-15 min twice daily.  Send me a message in 1 mo if the shoulders are not improving.   Let us know if you need anything.  EXERCISES  RANGE OF MOTION (ROM) AND STRETCHING EXERCISES These exercises may help you when beginning to rehabilitate your injury. While completing these exercises, remember:  Restoring tissue flexibility helps normal motion to return to the joints. This allows healthier, less painful movement and activity. An effective stretch should be held for at least 30 seconds. A stretch should never be painful. You should only feel a gentle lengthening or release in the stretched tissue.  ROM - Pendulum Bend at the waist so that your right / left arm falls away from your body. Support yourself with your opposite hand on a solid surface, such as a table or a countertop. Your right / left arm should be perpendicular to the ground. If it is not perpendicular, you need to lean over farther. Relax the muscles in your right / left arm and shoulder as much as possible. Gently sway your hips and trunk so they move your right / left arm without any use of your right / left shoulder muscles. Progress your movements so that your right / left arm moves side to side, then forward and backward, and finally, both clockwise and counterclockwise. Complete 10-15 repetitions in each direction. Many people use this exercise to relieve discomfort in their shoulder as well as to gain range of motion. Repeat 2 times. Complete this exercise 3 times per week.  STRETCH - Flexion, Standing Stand with good posture. With an underhand grip on your right / left hand and an overhand grip on the opposite hand, grasp a broomstick or cane so that your hands are a little more than shoulder-width  apart. Keeping your right / left elbow straight and shoulder muscles relaxed, push the stick with your opposite hand to raise your right / left arm in front of your body and then overhead. Raise your arm until you feel a stretch in your right / left shoulder, but before you have increased shoulder pain. Try to avoid shrugging your right / left shoulder as your arm rises by keeping your shoulder blade tucked down and toward your mid-back spine. Hold 30 seconds. Slowly return to the starting position. Repeat 2 times. Complete this exercise 3 times per week.  STRETCH - Internal Rotation Place your right / left hand behind your back, palm-up. Throw a towel or belt over your opposite shoulder. Grasp the towel/belt with your right / left hand. While keeping an upright posture, gently pull up on the towel/belt until you feel a stretch in the front of your right / left shoulder. Avoid shrugging your right / left shoulder as your arm rises by keeping your shoulder blade tucked down and toward your mid-back spine. Hold 30. Release the stretch by lowering your opposite hand. Repeat 2 times. Complete this exercise 3 times per week.  STRETCH - External Rotation and Abduction Stagger your stance through a doorframe. It does not matter which foot is forward. As instructed by your physician, physical therapist or athletic trainer, place your hands: And forearms above your head and on the door frame. And forearms at head-height and on the door frame.  At elbow-height and on the door frame. Keeping your head and chest upright and your stomach muscles tight to prevent over-extending your low-back, slowly shift your weight onto your front foot until you feel a stretch across your chest and/or in the front of your shoulders. Hold 30 seconds. Shift your weight to your back foot to release the stretch. Repeat 2 times. Complete this stretch 3 times per week.   STRENGTHENING EXERCISES  These exercises may help you  when beginning to rehabilitate your injury. They may resolve your symptoms with or without further involvement from your physician, physical therapist or athletic trainer. While completing these exercises, remember:  Muscles can gain both the endurance and the strength needed for everyday activities through controlled exercises. Complete these exercises as instructed by your physician, physical therapist or athletic trainer. Progress the resistance and repetitions only as guided. You may experience muscle soreness or fatigue, but the pain or discomfort you are trying to eliminate should never worsen during these exercises. If this pain does worsen, stop and make certain you are following the directions exactly. If the pain is still present after adjustments, discontinue the exercise until you can discuss the trouble with your clinician. If advised by your physician, during your recovery, avoid activity or exercises which involve actions that place your right / left hand or elbow above your head or behind your back or head. These positions stress the tissues which are trying to heal.  STRENGTH - Scapular Depression and Adduction With good posture, sit on a firm chair. Supported your arms in front of you with pillows, arm rests or a table top. Have your elbows in line with the sides of your body. Gently draw your shoulder blades down and toward your mid-back spine. Gradually increase the tension without tensing the muscles along the top of your shoulders and the back of your neck. Hold for 3 seconds. Slowly release the tension and relax your muscles completely before completing the next repetition. After you have practiced this exercise, remove the arm support and complete it in standing as well as sitting. Repeat 2 times. Complete this exercise 3 times per week.   STRENGTH - External Rotators Secure a rubber exercise band/tubing to a fixed object so that it is at the same height as your right / left  elbow when you are standing or sitting on a firm surface. Stand or sit so that the secured exercise band/tubing is at your side that is not injured. Bend your elbow 90 degrees. Place a folded towel or small pillow under your right / left arm so that your elbow is a few inches away from your side. Keeping the tension on the exercise band/tubing, pull it away from your body, as if pivoting on your elbow. Be sure to keep your body steady so that the movement is only coming from your shoulder rotating. Hold 3 seconds. Release the tension in a controlled manner as you return to the starting position. Repeat 2 times. Complete this exercise 3 times per week.   STRENGTH - Supraspinatus Stand or sit with good posture. Grasp a 2-3 lb weight or an exercise band/tubing so that your hand is "thumbs-up," like when you shake hands. Slowly lift your right / left hand from your thigh into the air, traveling about 30 degrees from straight out at your side. Lift your hand to shoulder height or as far as you can without increasing any shoulder pain. Initially, many people do not lift their hands above shoulder  height. Avoid shrugging your right / left shoulder as your arm rises by keeping your shoulder blade tucked down and toward your mid-back spine. Hold for 3 seconds. Control the descent of your hand as you slowly return to your starting position. Repeat 2 times. Complete this exercise 3 times per week.   STRENGTH - Shoulder Extensors Secure a rubber exercise band/tubing so that it is at the height of your shoulders when you are either standing or sitting on a firm arm-less chair. With a thumbs-up grip, grasp an end of the band/tubing in each hand. Straighten your elbows and lift your hands straight in front of you at shoulder height. Step back away from the secured end of band/tubing until it becomes tense. Squeezing your shoulder blades together, pull your hands down to the sides of your thighs. Do not allow your  hands to go behind you. Hold for 3 seconds. Slowly ease the tension on the band/tubing as you reverse the directions and return to the starting position. Repeat 2 times. Complete this exercise 3 times per week.   STRENGTH - Scapular Retractors Secure a rubber exercise band/tubing so that it is at the height of your shoulders when you are either standing or sitting on a firm arm-less chair. With a palm-down grip, grasp an end of the band/tubing in each hand. Straighten your elbows and lift your hands straight in front of you at shoulder height. Step back away from the secured end of band/tubing until it becomes tense. Squeezing your shoulder blades together, draw your elbows back as you bend them. Keep your upper arm lifted away from your body throughout the exercise. Hold 3 seconds. Slowly ease the tension on the band/tubing as you reverse the directions and return to the starting position. Repeat 2 times. Complete this exercise 3 times per week.  STRENGTH - Scapular Depressors Find a sturdy chair without wheels, such as a from a dining room table. Keeping your feet on the floor, lift your bottom from the seat and lock your elbows. Keeping your elbows straight, allow gravity to pull your body weight down. Your shoulders will rise toward your ears. Raise your body against gravity by drawing your shoulder blades down your back, shortening the distance between your shoulders and ears. Although your feet should always maintain contact with the floor, your feet should progressively support less body weight as you get stronger. Hold 3 seconds. In a controlled and slow manner, lower your body weight to begin the next repetition. Repeat 2 times. Complete this exercise 3 times per week.    This information is not intended to replace advice given to you by your health care provider. Make sure you discuss any questions you have with your health care provider.   Document Released: 10/28/2005 Document  Revised: 01/04/2015 Document Reviewed: 03/28/2009 Elsevier Interactive Patient Education [employer].

## 2023-02-19 NOTE — Progress Notes (Signed)
Subjective:   Chief Complaint  Patient presents with   Follow-up    6 month     Luke Sims is a 67 y.o. male here for follow-up of diabetes.   [name]'s self monitored glucose range is mid-low 100's.  Patient denies hypoglycemic reactions. He checks his glucose levels several times per week.  Patient does not require insulin.   Medications include: Jardiance 25 mg/d, Metformin XR 500 mg bid Diet is usually healthy.  Exercise: limited  Hypertension Patient presents for hypertension follow up. He does monitor home blood pressures. Blood pressures ranging on average from 140's/70's. He is compliant with medications- Norvasc 5 mg/d, Diovan HCT 320-25 mg/d. Patient has these side effects of medication: none Diet/exercise as above. No CP or SOB.   Shoulders are getting worse. R is worse than L. No inj or change in activity. Feels clicking in R shoulder.   Past Medical History:  Diagnosis Date   Atrial flutter ([place])    Colon polyps    Depression    Diabetes mellitus without complication (HCC)    DISH (diffuse idiopathic skeletal hyperostosis)    Heart disease    Hypertension    Sleep apnea      Related testing: Retinal exam: Done Pneumovax: done  Objective:  BP 118/72 (BP Location: Left Arm, Patient Position: Sitting, Cuff Size: Large)   Pulse (!) 55   Temp 98 F (36.7 C) (Oral)   Ht '5\' 9"'$  (1.753 m)   Wt 262 lb (118.8 kg)   SpO2 95%   BMI 38.69 kg/m  General:  Well developed, well nourished, in no apparent distress Skin:  Warm, no pallor or diaphoresis on exposed surface.  MSK: Decreased active and passive ROM. Neg cross over, [place] Lungs:  CTAB, no access msc use Cardio:  Reg rhythm, bradycardic, no bruits, no LE edema Psych: Age appropriate judgment and insight  Assessment:   Type 2 diabetes mellitus with other circulatory complication, without long-term current use of insulin (HCC) - Plan: Comprehensive metabolic panel, Lipid panel, Hemoglobin  A1c  Primary hypertension  Adhesive capsulitis of both shoulders   Plan:   Chronic, stable. Cont Metformin XR 500 mg bid, Jardiance 25 mg/d. Counseled on diet and exercise. Chronic, stable. Cont Norvasc 5 mg/d, Diovan HCT 320-25 mg/d. Will bring in BP monitor to ck against ours in 2 weeks.  Stretches/exercises. PT if no better.  F/u in 6 mo. The patient voiced understanding and agreement to the plan.  [place], DO 02/19/23 9:04 AM

## 2023-02-26 DIAGNOSIS — Z7901 Long term (current) use of anticoagulants: Secondary | ICD-10-CM | POA: Diagnosis not present

## 2023-02-26 DIAGNOSIS — I2510 Atherosclerotic heart disease of native coronary artery without angina pectoris: Secondary | ICD-10-CM | POA: Diagnosis not present

## 2023-02-26 DIAGNOSIS — I480 Paroxysmal atrial fibrillation: Secondary | ICD-10-CM | POA: Diagnosis not present

## 2023-02-26 DIAGNOSIS — I10 Essential (primary) hypertension: Secondary | ICD-10-CM | POA: Diagnosis not present

## 2023-02-26 DIAGNOSIS — Z87891 Personal history of nicotine dependence: Secondary | ICD-10-CM | POA: Diagnosis not present

## 2023-02-26 DIAGNOSIS — Z79899 Other long term (current) drug therapy: Secondary | ICD-10-CM | POA: Diagnosis not present

## 2023-03-07 ENCOUNTER — Other Ambulatory Visit: Payer: Self-pay | Admitting: Family Medicine

## 2023-03-08 MED ORDER — EMPAGLIFLOZIN 10 MG PO TABS: 10.0000 mg | ORAL_TABLET | Freq: Every day | ORAL | 3 refills | Status: DC

## 2023-04-22 DIAGNOSIS — M459 Ankylosing spondylitis of unspecified sites in spine: Secondary | ICD-10-CM | POA: Diagnosis not present

## 2023-04-22 DIAGNOSIS — M9903 Segmental and somatic dysfunction of lumbar region: Secondary | ICD-10-CM | POA: Diagnosis not present

## 2023-04-22 DIAGNOSIS — M9904 Segmental and somatic dysfunction of sacral region: Secondary | ICD-10-CM | POA: Diagnosis not present

## 2023-04-22 DIAGNOSIS — M4810 Ankylosing hyperostosis [Forestier], site unspecified: Secondary | ICD-10-CM | POA: Diagnosis not present

## 2023-04-23 DIAGNOSIS — Z461 Encounter for fitting and adjustment of hearing aid: Secondary | ICD-10-CM | POA: Diagnosis not present

## 2023-05-03 ENCOUNTER — Other Ambulatory Visit: Payer: Self-pay | Admitting: Family Medicine

## 2023-05-03 DIAGNOSIS — N401 Enlarged prostate with lower urinary tract symptoms: Secondary | ICD-10-CM

## 2023-05-11 ENCOUNTER — Other Ambulatory Visit: Payer: Self-pay | Admitting: Family Medicine

## 2023-05-11 DIAGNOSIS — M4810 Ankylosing hyperostosis [Forestier], site unspecified: Secondary | ICD-10-CM | POA: Diagnosis not present

## 2023-05-11 DIAGNOSIS — M9903 Segmental and somatic dysfunction of lumbar region: Secondary | ICD-10-CM | POA: Diagnosis not present

## 2023-05-11 DIAGNOSIS — M459 Ankylosing spondylitis of unspecified sites in spine: Secondary | ICD-10-CM | POA: Diagnosis not present

## 2023-05-11 DIAGNOSIS — M9904 Segmental and somatic dysfunction of sacral region: Secondary | ICD-10-CM | POA: Diagnosis not present

## 2023-05-11 MED ORDER — VALSARTAN-HYDROCHLOROTHIAZIDE 320-25 MG PO TABS: 1.0000 | ORAL_TABLET | Freq: Every day | ORAL | 0 refills | Status: DC

## 2023-05-14 ENCOUNTER — Other Ambulatory Visit: Payer: BC Managed Care – PPO

## 2023-05-14 ENCOUNTER — Ambulatory Visit (INDEPENDENT_AMBULATORY_CARE_PROVIDER_SITE_OTHER): Payer: BC Managed Care – PPO

## 2023-05-14 VITALS — BP 126/82 | HR 82

## 2023-05-14 DIAGNOSIS — I10 Essential (primary) hypertension: Secondary | ICD-10-CM

## 2023-05-14 NOTE — Progress Notes (Signed)
Pt here for Blood pressure check per Dr.Wendling  follow-up  diabetes.  Glucose levels and BP Check  Pt currently takes :Norvasc 5 mg/d, Diovan HCT 320-25 mg/d :  BP Readings from Last 3 Encounters:  02/19/23 118/72  08/21/22 138/78  02/20/22 128/84   Pt reports compliance with medication  BP today @ =128/82LA 126/82 HR =80 Glucose Range=163  Pt advised per: Follow up next visit

## 2023-06-09 ENCOUNTER — Ambulatory Visit: Payer: BC Managed Care – PPO | Admitting: Family Medicine

## 2023-06-15 ENCOUNTER — Ambulatory Visit: Payer: BC Managed Care – PPO | Admitting: Family Medicine

## 2023-06-21 ENCOUNTER — Other Ambulatory Visit: Payer: Self-pay | Admitting: Family Medicine

## 2023-06-21 MED ORDER — VALSARTAN-HYDROCHLOROTHIAZIDE 320-25 MG PO TABS: 1.0000 | ORAL_TABLET | Freq: Every day | ORAL | 0 refills | Status: DC

## 2023-06-22 ENCOUNTER — Encounter: Payer: Self-pay | Admitting: Family Medicine

## 2023-06-22 ENCOUNTER — Other Ambulatory Visit: Payer: Self-pay | Admitting: Family Medicine

## 2023-06-29 ENCOUNTER — Ambulatory Visit (INDEPENDENT_AMBULATORY_CARE_PROVIDER_SITE_OTHER): Payer: Medicare HMO | Admitting: Family Medicine

## 2023-06-29 ENCOUNTER — Encounter: Payer: Self-pay | Admitting: Family Medicine

## 2023-06-29 VITALS — BP 112/72 | HR 60 | Temp 98.3°F | Ht 69.0 in | Wt 262.5 lb

## 2023-06-29 DIAGNOSIS — Z7984 Long term (current) use of oral hypoglycemic drugs: Secondary | ICD-10-CM

## 2023-06-29 DIAGNOSIS — Z23 Encounter for immunization: Secondary | ICD-10-CM

## 2023-06-29 DIAGNOSIS — E1159 Type 2 diabetes mellitus with other circulatory complications: Secondary | ICD-10-CM | POA: Diagnosis not present

## 2023-06-29 DIAGNOSIS — Z1211 Encounter for screening for malignant neoplasm of colon: Secondary | ICD-10-CM

## 2023-06-29 MED ORDER — METFORMIN HCL ER 500 MG PO TB24: 1000.0000 mg | ORAL_TABLET | Freq: Two times a day (BID) | ORAL | 3 refills | Status: DC

## 2023-06-29 NOTE — Patient Instructions (Addendum)
Give [place] 2-3 business days to get the results of your labs back.   Keep the diet clean and stay active.  Please call your insurance company to see what SGLT-2 inhibitors are covered like Invokana, Jardiance or [place]. Asking about weekly shot coverage for meds like Mounjaro, Trulicity or Ozempic is desirable information as well. There is an oral version of the injectables called Rybelsus that you may do better with.   If you do not hear anything about your referral in the next 1-2 weeks, call our office and ask for an update.  We are checking some labs for your enlarged gland.   Let us know if you need anything.

## 2023-06-29 NOTE — Progress Notes (Signed)
Subjective:   Chief Complaint  Patient presents with   Follow-up    Luke Sims is a 67 y.o. male here for follow-up of diabetes.   [name] is 140-160's.  Patient denies hypoglycemic reactions. He checks his glucose levels 2 time(s) per week. Patient does not require insulin.   Medications include: Metformin XR 1000 mg bid, Jardiance 10 mg/d Diet is OK.  Exercise: some walking  Past Medical History:  Diagnosis Date   Atrial flutter (HCC)    Colon polyps    Depression    Diabetes mellitus without complication (HCC)    DISH (diffuse idiopathic skeletal hyperostosis)    Heart disease    Hypertension    Sleep apnea      Related testing: Retinal exam: Done Pneumovax: done  Objective:  BP 112/72 (BP Location: Left Arm, Patient Position: Sitting, Cuff Size: Large)   Pulse 60   Temp 98.3 F (36.8 C) (Oral)   Ht 5\' 9"  (1.753 m)   Wt 262 lb 8 oz (119.1 kg)   SpO2 96%   BMI 38.76 kg/m  General:  Well developed, well nourished, in no apparent distress Lungs:  CTAB, no access msc use Cardio:  RRR, no bruits, no LE edema Psych: Age appropriate judgment and insight  Assessment:   Type 2 diabetes mellitus with other circulatory complication, without long-term current use of insulin (HCC) - Plan: Hemoglobin A1c, Hemoglobin A1c, CBC  Screen for colon cancer - Plan: Ambulatory referral to Gastroenterology   Plan:   Chronic, uncontrolled. [name] is expensive, will have him contact ins company. Cont metformin XR 1000 mg bid for now, may have to add GLP-1 or Actos.  Counseled on diet and exercise. PCV20 today.  F/u in 3 mo. The patient voiced understanding and agreement to the plan.  Luke Roche [place], DO 06/29/23 4:10 PM

## 2023-06-29 NOTE — Addendum Note (Signed)
Addended by: Scharlene Gloss B on: 06/29/2023 04:18 PM   Modules accepted: Orders

## 2023-06-29 NOTE — Addendum Note (Signed)
Addended by: Rosita Kea on: 06/29/2023 04:19 PM   Modules accepted: Orders

## 2023-06-30 ENCOUNTER — Other Ambulatory Visit: Payer: Self-pay | Admitting: Family Medicine

## 2023-06-30 LAB — CBC
HCT: 47.4 % (ref 39.0–52.0)
Hemoglobin: 16.1 g/dL (ref 13.0–17.0)
MCHC: 34 g/dL (ref 30.0–36.0)
MCV: 89.6 fl (ref 78.0–100.0)
Platelets: 194 10*3/uL (ref 150.0–400.0)
RBC: 5.29 Mil/uL (ref 4.22–5.81)
RDW: 13.6 % (ref 11.5–15.5)
WBC: 6.5 10*3/uL (ref 4.0–10.5)

## 2023-06-30 LAB — HEMOGLOBIN A1C: Hgb A1c MFr Bld: 7.4 % — ABNORMAL HIGH (ref 4.6–6.5)

## 2023-06-30 MED ORDER — PIOGLITAZONE HCL 30 MG PO TABS: 30.0000 mg | ORAL_TABLET | Freq: Every day | ORAL | 3 refills | Status: DC

## 2023-06-30 NOTE — Addendum Note (Signed)
Addended by: Kollin Udell P on: 06/30/2023 05:06 PM   Modules accepted: Level of Service  

## 2023-07-10 ENCOUNTER — Encounter: Payer: Self-pay | Admitting: Family Medicine

## 2023-07-10 ENCOUNTER — Other Ambulatory Visit: Payer: Self-pay | Admitting: Family Medicine

## 2023-07-12 ENCOUNTER — Other Ambulatory Visit: Payer: Self-pay | Admitting: Family Medicine

## 2023-07-12 MED ORDER — ROSUVASTATIN CALCIUM 20 MG PO TABS: 20.0000 mg | ORAL_TABLET | Freq: Every day | ORAL | 1 refills | Status: DC

## 2023-07-13 MED ORDER — ROSUVASTATIN CALCIUM 20 MG PO TABS: 20.0000 mg | ORAL_TABLET | Freq: Every day | ORAL | 1 refills | Status: DC

## 2023-07-19 ENCOUNTER — Encounter: Payer: Self-pay | Admitting: Family Medicine

## 2023-07-20 ENCOUNTER — Other Ambulatory Visit: Payer: Self-pay | Admitting: Family Medicine

## 2023-07-29 ENCOUNTER — Encounter: Payer: Self-pay | Admitting: Family Medicine

## 2023-07-29 DIAGNOSIS — N401 Enlarged prostate with lower urinary tract symptoms: Secondary | ICD-10-CM

## 2023-07-29 MED ORDER — METFORMIN HCL ER 500 MG PO TB24: 1000.0000 mg | ORAL_TABLET | Freq: Two times a day (BID) | ORAL | 3 refills | Status: DC

## 2023-07-29 MED ORDER — TAMSULOSIN HCL 0.4 MG PO CAPS: ORAL_CAPSULE | ORAL | 3 refills | Status: DC

## 2023-08-12 ENCOUNTER — Other Ambulatory Visit: Payer: Self-pay | Admitting: Family Medicine

## 2023-08-13 MED ORDER — APIXABAN 5 MG PO TABS: 5.0000 mg | ORAL_TABLET | Freq: Two times a day (BID) | ORAL | 0 refills | Status: DC

## 2023-08-17 ENCOUNTER — Telehealth: Payer: Self-pay

## 2023-08-17 ENCOUNTER — Telehealth: Payer: Self-pay | Admitting: Family Medicine

## 2023-08-17 MED ORDER — APIXABAN 5 MG PO TABS: 5.0000 mg | ORAL_TABLET | Freq: Two times a day (BID) | ORAL | 0 refills | Status: DC

## 2023-08-17 NOTE — Telephone Encounter (Signed)
Pt called stating that his insurance covers an 100 day supply for medications and wanted to look into putting a permanent note regarding this in his chart.

## 2023-08-17 NOTE — Telephone Encounter (Signed)
[facility] [place] [facility] - Client Client Site [facility] - Day Provider Arva Chafe- MD Contact Type Call Who Is Calling Patient / Member / Family / Caregiver Caller Name Savyon Mockler Caller Phone Number [phone] Patient Name Luke Sims Patient DOB 08-Feb-1956 Call Type Message Only Information Provided Reason for Call Medication Question / Request Initial Comment Caller states they are needing help with medication. The medication was sent wrong pharmacy. They would also like to know office hours. Additional Comment Provided of office hours. Caller decline triage. Disp. Time Disposition Final User 08/17/2023 7:43:47 AM General Information Provided Yes [place], Deborya Call Closed By: Rayna Sexton Transaction Date/Time: 08/17/2023 7:40:19 AM (ET)

## 2023-08-17 NOTE — Telephone Encounter (Signed)
Rx resent.

## 2023-08-17 NOTE — Telephone Encounter (Signed)
Pt called stating that his medication was supposed to be routed to the following pharmacy:  [facility] [id number] - [place], [place] - [address], [place] [address] Phone: [phone]  Fax: [phone]   Pt stated his MyChart is holding the info of the past pharmacy for some reason and is unsure why as he has removed it. Pt was transferred to MyChart Help Desk to look into this to prevent future issues with requests. Routed HP due to error.

## 2023-08-17 NOTE — Telephone Encounter (Signed)
Noted  

## 2023-08-18 ENCOUNTER — Other Ambulatory Visit: Payer: Self-pay | Admitting: Family Medicine

## 2023-08-19 ENCOUNTER — Other Ambulatory Visit: Payer: Self-pay | Admitting: Family Medicine

## 2023-08-19 MED ORDER — APIXABAN 5 MG PO TABS: 5.0000 mg | ORAL_TABLET | Freq: Two times a day (BID) | ORAL | 1 refills | Status: DC

## 2023-08-27 ENCOUNTER — Encounter: Payer: BC Managed Care – PPO | Admitting: Family Medicine

## 2023-09-09 ENCOUNTER — Telehealth: Payer: Self-pay | Admitting: Family Medicine

## 2023-09-09 MED ORDER — VALSARTAN-HYDROCHLOROTHIAZIDE 320-25 MG PO TABS: 1.0000 | ORAL_TABLET | Freq: Every day | ORAL | 0 refills | Status: DC

## 2023-09-09 MED ORDER — EMPAGLIFLOZIN 10 MG PO TABS: 10.0000 mg | ORAL_TABLET | Freq: Every day | ORAL | 3 refills | Status: DC

## 2023-09-09 NOTE — Telephone Encounter (Signed)
**  Pt stated that his insurance covers a 100 day supply and would like to have the scripts written as such.**  Prescription Request  09/09/2023  Is this a "Controlled Substance" medicine? No  LOV: 06/29/2023  What is the name of the medication or equipment?   empagliflozin (JARDIANCE) 10 MG TABS tablet [[id number]]  valsartan-hydrochlorothiazide (DIOVAN-HCT) 320-25 MG tablet [[id number]]  Have you contacted your pharmacy to request a refill? No   Which pharmacy would you like this sent to?  [facility] [id number] - [place], [place] - [address] [place] [address] Phone: [phone] Fax: [phone]    Patient notified that their request is being sent to the clinical staff for review and that they should receive a response within 2 business days.   Please advise at Mobile [phone] (mobile)

## 2023-09-09 NOTE — Addendum Note (Signed)
Addended by: Maximino Sarin on: 09/09/2023 01:22 PM   Modules accepted: Orders

## 2023-09-09 NOTE — Telephone Encounter (Signed)
Rx sent 

## 2023-10-01 ENCOUNTER — Encounter: Payer: Medicare HMO | Admitting: Family Medicine

## 2023-10-13 ENCOUNTER — Other Ambulatory Visit: Payer: Self-pay | Admitting: Family Medicine

## 2023-10-19 ENCOUNTER — Ambulatory Visit: Payer: Medicare HMO | Admitting: Family Medicine

## 2023-10-19 ENCOUNTER — Other Ambulatory Visit: Payer: Self-pay | Admitting: Family Medicine

## 2023-10-19 ENCOUNTER — Encounter: Payer: Self-pay | Admitting: Family Medicine

## 2023-10-19 VITALS — BP 121/80 | HR 62 | Temp 98.2°F | Ht 69.0 in | Wt 266.4 lb

## 2023-10-19 DIAGNOSIS — Z0000 Encounter for general adult medical examination without abnormal findings: Secondary | ICD-10-CM | POA: Diagnosis not present

## 2023-10-19 DIAGNOSIS — M4810 Ankylosing hyperostosis [Forestier], site unspecified: Secondary | ICD-10-CM

## 2023-10-19 DIAGNOSIS — Z7984 Long term (current) use of oral hypoglycemic drugs: Secondary | ICD-10-CM

## 2023-10-19 DIAGNOSIS — I480 Paroxysmal atrial fibrillation: Secondary | ICD-10-CM | POA: Diagnosis not present

## 2023-10-19 DIAGNOSIS — Z23 Encounter for immunization: Secondary | ICD-10-CM

## 2023-10-19 DIAGNOSIS — E1159 Type 2 diabetes mellitus with other circulatory complications: Secondary | ICD-10-CM

## 2023-10-19 DIAGNOSIS — I2510 Atherosclerotic heart disease of native coronary artery without angina pectoris: Secondary | ICD-10-CM

## 2023-10-19 DIAGNOSIS — Z125 Encounter for screening for malignant neoplasm of prostate: Secondary | ICD-10-CM | POA: Diagnosis not present

## 2023-10-19 LAB — COMPREHENSIVE METABOLIC PANEL
ALT: 19 U/L (ref 0–53)
AST: 17 U/L (ref 0–37)
Albumin: 4.8 g/dL (ref 3.5–5.2)
Alkaline Phosphatase: 46 U/L (ref 39–117)
BUN: 14 mg/dL (ref 6–23)
CO2: 31 meq/L (ref 19–32)
Calcium: 10.1 mg/dL (ref 8.4–10.5)
Chloride: 101 meq/L (ref 96–112)
Creatinine, Ser: 0.88 mg/dL (ref 0.40–1.50)
GFR: 90.18 mL/min (ref >60.00)
Glucose, Bld: 156 mg/dL — ABNORMAL HIGH (ref 70–99)
Potassium: 3.7 meq/L (ref 3.5–5.1)
Sodium: 141 meq/L (ref 135–145)
Total Bilirubin: 0.9 mg/dL (ref 0.2–1.2)
Total Protein: 7.2 g/dL (ref 6.0–8.3)

## 2023-10-19 LAB — LIPID PANEL
Cholesterol: 112 mg/dL (ref 0–200)
HDL: 38.1 mg/dL — ABNORMAL LOW (ref >39.00)
LDL Cholesterol: 52 mg/dL (ref 0–99)
NonHDL: 73.94
Total CHOL/HDL Ratio: 3
Triglycerides: 108 mg/dL (ref 0.0–149.0)
VLDL: 21.6 mg/dL (ref 0.0–40.0)

## 2023-10-19 LAB — CBC
HCT: 48.9 % (ref 39.0–52.0)
Hemoglobin: 16.4 g/dL (ref 13.0–17.0)
MCHC: 33.5 g/dL (ref 30.0–36.0)
MCV: 90.1 fL (ref 78.0–100.0)
Platelets: 192 10*3/uL (ref 150.0–400.0)
RBC: 5.42 Mil/uL (ref 4.22–5.81)
RDW: 14.2 % (ref 11.5–15.5)
WBC: 5.3 10*3/uL (ref 4.0–10.5)

## 2023-10-19 LAB — MICROALBUMIN / CREATININE URINE RATIO
Creatinine,U: 41.2 mg/dL
Microalb Creat Ratio: 17.3 mg/g (ref 0.0–30.0)
Microalb, Ur: 7.1 mg/dL — ABNORMAL HIGH (ref 0.0–1.9)

## 2023-10-19 LAB — HEMOGLOBIN A1C: Hgb A1c MFr Bld: 7.6 % — ABNORMAL HIGH (ref 4.6–6.5)

## 2023-10-19 LAB — PSA, MEDICARE: PSA: 1.81 ng/mL (ref 0.10–4.00)

## 2023-10-19 MED ORDER — GLIPIZIDE 5 MG PO TABS: 5.0000 mg | ORAL_TABLET | Freq: Two times a day (BID) | ORAL | 3 refills | Status: DC

## 2023-10-19 NOTE — Progress Notes (Signed)
Chief Complaint  Patient presents with   Annual Exam   Insomnia    Work related and causing some anxiety     Well Male JAP HAASCH is here for a complete physical.   His last physical was >1 year ago.  Current diet: in general, a "healthy" diet.   Current exercise: limited Weight trend: stable Fatigue out of ordinary? No. Seat belt? Yes.   Advanced directive? Yes  Health maintenance Shingrix- No Colonoscopy- No Tetanus- Yes Hep C- Yes Pneumonia vaccine- Yes  Past Medical History:  Diagnosis Date   Atrial flutter (HCC)    Colon polyps    Depression    Diabetes mellitus without complication (HCC)    DISH (diffuse idiopathic skeletal hyperostosis)    Heart disease    Hypertension    Sleep apnea     Past Surgical History:  Procedure Laterality Date   COLONOSCOPY     Medications  Current Outpatient Medications on File Prior to Visit  Medication Sig Dispense Refill   amLODipine (NORVASC) 5 MG tablet TAKE 1 TABLET DAILY 90 tablet 3   apixaban (ELIQUIS) 5 MG TABS tablet Take 1 tablet (5 mg total) by mouth 2 (two) times daily. 180 tablet 1   aspirin EC 81 MG tablet Take 81 mg by mouth daily.     empagliflozin (JARDIANCE) 10 MG TABS tablet Take 1 tablet (10 mg total) by mouth daily. 30 tablet 3   metFORMIN (GLUCOPHAGE-XR) 500 MG 24 hr tablet Take 2 tablets (1,000 mg total) by mouth in the morning and at bedtime. 180 tablet 3   rosuvastatin (CRESTOR) 20 MG tablet Take 1 tablet (20 mg total) by mouth daily. 100 tablet 1   tamsulosin (FLOMAX) 0.4 MG CAPS capsule TAKE 1 CAPSULE(0.4 MG) BY MOUTH DAILY 100 capsule 3   valsartan-hydrochlorothiazide (DIOVAN-HCT) 320-25 MG tablet TAKE 1 TABLET BY MOUTH DAILY 30 tablet 0   Allergies Allergies  Allergen Reactions   Ace Inhibitors Cough and Other (See Comments)    Cough    Family History Family History  Problem Relation Age of Onset   Diabetes Mother    Heart disease Father    Hyperlipidemia Father    Diabetes Sister     COPD Brother    Hypertension Son    Arthritis Sister    Arthritis Sister    Review of Systems: Constitutional:  no fevers Eye:  no recent significant change in vision Ears:  No changes in hearing Nose/Mouth/Throat:  no complaints of nasal congestion, no sore throat Cardiovascular: no chest pain Respiratory:  No shortness of breath Gastrointestinal:  No change in bowel habits GU:  No frequency Integumentary:  no abnormal skin lesions reported Neurologic:  no headaches Endocrine:  denies unexplained weight changes  Exam BP 121/80 (BP Location: Left Arm, Patient Position: Sitting, Cuff Size: Large)   Pulse 62   Temp 98.2 F (36.8 C) (Oral)   Ht 5\' 9"  (1.753 m)   Wt 266 lb 6 oz (120.8 kg)   SpO2 96%   BMI 39.34 kg/m  General:  well developed, well nourished, in no apparent distress Skin:  no significant moles, warts, or growths Head:  no masses, lesions, or tenderness Eyes:  pupils equal and round, sclera anicteric without injection Ears:  canals without lesions, TMs shiny without retraction, no obvious effusion, no erythema Nose:  nares patent, mucosa normal Throat/Pharynx:  lips and gingiva without lesion; tongue and uvula midline; non-inflamed pharynx; no exudates or postnasal drainage Lungs:  clear to  auscultation, breath sounds equal bilaterally, no respiratory distress Cardio:  regular rate and rhythm, no LE edema or bruits Rectal: Deferred GI: BS+, S, NT, ND, no masses or organomegaly Musculoskeletal:  symmetrical muscle groups noted without atrophy or deformity Neuro:  gait normal; deep tendon reflexes normal and symmetric Psych: well oriented with normal range of affect and appropriate judgment/insight  Assessment and Plan  Well adult exam  Type 2 diabetes mellitus with other circulatory complication, without long-term current use of insulin (HCC) - Plan: CBC, Comprehensive metabolic panel, Lipid panel, Hemoglobin A1c, Microalbumin / creatinine urine  ratio  Screening for prostate cancer - Plan: PSA, Medicare ( [place] only)   Well 67 y.o. male. Counseled on diet and exercise. Advanced directive form requested today.  Flu shot today.  Shingrix rec'd.  Rec'd to sched colonoscopy.  He needs a new cardiologist. Will place referral to Dr. Servando Salina.  Other orders as above. Follow up in 6 mo.  The patient voiced understanding and agreement to the plan.  Luke Roche [place], DO 10/19/23 7:37 AM

## 2023-10-19 NOTE — Patient Instructions (Addendum)
Give [place] 2-3 business days to get the results of your labs back.   Keep the diet clean and stay active.  Aim to do some physical exertion for 150 minutes per week. This is typically divided into 5 days per week, 30 minutes per day. The activity should be enough to get your heart rate up. Anything is better than nothing if you have time constraints.  Please get me a copy of your advanced directive form at your convenience.   Please contact the GI team at: [phone]  The Shingrix vaccine (for shingles) is a 2 shot series spaced 2-6 months apart. It can make people feel low energy, achy and almost like they have the flu for 48 hours after injection. 1/5 people can have nausea and/or vomiting. Please plan accordingly when deciding on when to get this shot. Call your pharmacy to get this. The second shot of the series is less severe regarding the side effects, but it still lasts 48 hours.   If you do not hear anything about your referral in the next 1-2 weeks, call our office and ask for an update.  Let us know if you need anything.

## 2023-11-16 DIAGNOSIS — Z7984 Long term (current) use of oral hypoglycemic drugs: Secondary | ICD-10-CM | POA: Diagnosis not present

## 2023-11-16 DIAGNOSIS — H2513 Age-related nuclear cataract, bilateral: Secondary | ICD-10-CM | POA: Diagnosis not present

## 2023-11-16 DIAGNOSIS — H52203 Unspecified astigmatism, bilateral: Secondary | ICD-10-CM | POA: Diagnosis not present

## 2023-11-16 DIAGNOSIS — E119 Type 2 diabetes mellitus without complications: Secondary | ICD-10-CM | POA: Diagnosis not present

## 2023-11-16 DIAGNOSIS — H268 Other specified cataract: Secondary | ICD-10-CM | POA: Diagnosis not present

## 2023-11-16 DIAGNOSIS — H43813 Vitreous degeneration, bilateral: Secondary | ICD-10-CM | POA: Diagnosis not present

## 2023-11-16 DIAGNOSIS — H11153 Pinguecula, bilateral: Secondary | ICD-10-CM | POA: Diagnosis not present

## 2023-11-16 DIAGNOSIS — H5213 Myopia, bilateral: Secondary | ICD-10-CM | POA: Diagnosis not present

## 2023-11-16 DIAGNOSIS — H524 Presbyopia: Secondary | ICD-10-CM | POA: Diagnosis not present

## 2023-11-18 ENCOUNTER — Other Ambulatory Visit: Payer: Self-pay | Admitting: Family Medicine

## 2023-12-14 ENCOUNTER — Other Ambulatory Visit: Payer: Self-pay | Admitting: Family Medicine

## 2023-12-16 ENCOUNTER — Other Ambulatory Visit: Payer: Self-pay | Admitting: Family Medicine

## 2023-12-17 ENCOUNTER — Other Ambulatory Visit: Payer: Self-pay | Admitting: Family Medicine

## 2023-12-17 ENCOUNTER — Encounter: Payer: Self-pay | Admitting: Family Medicine

## 2023-12-17 ENCOUNTER — Encounter: Payer: Self-pay | Admitting: Internal Medicine

## 2023-12-17 MED ORDER — GLIPIZIDE 5 MG PO TABS: 10.0000 mg | ORAL_TABLET | Freq: Two times a day (BID) | ORAL | Status: DC

## 2023-12-27 ENCOUNTER — Other Ambulatory Visit: Payer: Self-pay | Admitting: Family Medicine

## 2023-12-31 ENCOUNTER — Encounter: Payer: Self-pay | Admitting: Family Medicine

## 2024-01-17 ENCOUNTER — Other Ambulatory Visit: Payer: Self-pay | Admitting: Family Medicine

## 2024-01-27 ENCOUNTER — Encounter: Payer: Self-pay | Admitting: Family Medicine

## 2024-01-27 ENCOUNTER — Other Ambulatory Visit: Payer: Self-pay | Admitting: Family Medicine

## 2024-01-27 MED ORDER — VALSARTAN-HYDROCHLOROTHIAZIDE 320-25 MG PO TABS: 1.0000 | ORAL_TABLET | Freq: Every day | ORAL | 0 refills | Status: DC

## 2024-01-27 MED ORDER — EMPAGLIFLOZIN 10 MG PO TABS: 10.0000 mg | ORAL_TABLET | Freq: Every day | ORAL | 1 refills | Status: DC

## 2024-02-12 ENCOUNTER — Other Ambulatory Visit: Payer: Self-pay | Admitting: Family Medicine

## 2024-02-14 DIAGNOSIS — M25512 Pain in left shoulder: Secondary | ICD-10-CM | POA: Diagnosis not present

## 2024-02-14 DIAGNOSIS — M7542 Impingement syndrome of left shoulder: Secondary | ICD-10-CM | POA: Diagnosis not present

## 2024-02-14 DIAGNOSIS — M25511 Pain in right shoulder: Secondary | ICD-10-CM | POA: Diagnosis not present

## 2024-02-16 ENCOUNTER — Ambulatory Visit: Payer: Medicare HMO | Admitting: Family Medicine

## 2024-02-18 ENCOUNTER — Encounter: Payer: Self-pay | Admitting: Family Medicine

## 2024-02-18 ENCOUNTER — Ambulatory Visit (INDEPENDENT_AMBULATORY_CARE_PROVIDER_SITE_OTHER): Payer: PPO | Admitting: Family Medicine

## 2024-02-18 VITALS — BP 134/82 | HR 49 | Temp 98.0°F | Resp 16 | Ht 69.0 in | Wt 275.2 lb

## 2024-02-18 DIAGNOSIS — E1169 Type 2 diabetes mellitus with other specified complication: Secondary | ICD-10-CM | POA: Diagnosis not present

## 2024-02-18 DIAGNOSIS — Z1211 Encounter for screening for malignant neoplasm of colon: Secondary | ICD-10-CM

## 2024-02-18 DIAGNOSIS — E669 Obesity, unspecified: Secondary | ICD-10-CM | POA: Diagnosis not present

## 2024-02-18 DIAGNOSIS — Z7984 Long term (current) use of oral hypoglycemic drugs: Secondary | ICD-10-CM

## 2024-02-18 MED ORDER — EMPAGLIFLOZIN 25 MG PO TABS: 25.0000 mg | ORAL_TABLET | Freq: Every day | ORAL | 3 refills | Status: AC

## 2024-02-18 NOTE — Patient Instructions (Addendum)
 Keep the diet clean and stay active.  Aim to do some physical exertion for 150 minutes per week. This is typically divided into 5 days per week, 30 minutes per day. The activity should be enough to get your heart rate up. Anything is better than nothing if you have time constraints.  Give [place] 2-3 business days to get the results of your labs back.   Ask your insurance company what non-insulin injectables they cover for diabetes.   Let us know if you need anything.

## 2024-02-18 NOTE — Progress Notes (Signed)
 Subjective:   Chief Complaint  Patient presents with   Follow-up    Follow up DM    Luke Sims is a 68 y.o. male here for follow-up of diabetes.   [name]'s self monitored glucose range is mid-high 100's.  Patient denies hypoglycemic reactions. He checks his glucose levels several times per week.  Patient does not require insulin.   Medications include: Jardiance 10 mg/d, metformin XR 1000 mg bid,Diet is OK.  Exercise: none No CP or SOB.   Past Medical History:  Diagnosis Date   Atrial flutter (HCC)    Colon polyps    Depression    Diabetes mellitus without complication (HCC)    DISH (diffuse idiopathic skeletal hyperostosis)    Heart disease    Hypertension    Sleep apnea      Related testing: Retinal exam: Done Pneumovax: done  Objective:  BP 134/82 (BP Location: Left Arm, Patient Position: Sitting)   Pulse (!) 49   Temp 98 F (36.7 C) (Oral)   Resp 16   Ht 5\' 9"  (1.753 m)   Wt 275 lb 3.2 oz (124.8 kg)   SpO2 95%   BMI 40.64 kg/m  General:  Well developed, well nourished, in no apparent distress Lungs:  CTAB, no access msc use Cardio:  bradycardic, reg rhythm, no bruits, 1+ pitting bl LE edema tapering at mid tibia Psych: Age appropriate judgment and insight  Assessment:   Type 2 diabetes mellitus with obesity (HCC) - Plan: Hemoglobin A1c  Screen for colon cancer - Plan: Ambulatory referral to Gastroenterology   Plan:   Chronic, not controlled. Cont metformin XR 1000 mg bid, increase Jardiance 10 mg/d to 25 mg/d. Counseled on diet and exercise. F/u in 3 mo. The patient voiced understanding and agreement to the plan.  [name] [place], [place] 02/18/24 4:43 PM

## 2024-02-19 ENCOUNTER — Encounter: Payer: Self-pay | Admitting: Family Medicine

## 2024-02-19 LAB — HEMOGLOBIN A1C
Hgb A1c MFr Bld: 9.1 %{Hb} — ABNORMAL HIGH (ref <5.7)
Mean Plasma Glucose: 214 mg/dL
eAG (mmol/L): 11.9 mmol/L

## 2024-02-21 ENCOUNTER — Encounter: Payer: Self-pay | Admitting: Family Medicine

## 2024-03-02 ENCOUNTER — Ambulatory Visit: Payer: Medicare HMO | Admitting: Internal Medicine

## 2024-03-15 DIAGNOSIS — M7542 Impingement syndrome of left shoulder: Secondary | ICD-10-CM | POA: Diagnosis not present

## 2024-03-19 ENCOUNTER — Encounter: Payer: Self-pay | Admitting: Family Medicine

## 2024-03-20 ENCOUNTER — Other Ambulatory Visit: Payer: Self-pay | Admitting: Family Medicine

## 2024-03-20 MED ORDER — TIRZEPATIDE 7.5 MG/0.5ML ~~LOC~~ SOAJ: 7.5000 mg | SUBCUTANEOUS | 0 refills | Status: AC

## 2024-03-20 MED ORDER — TIRZEPATIDE 2.5 MG/0.5ML ~~LOC~~ SOAJ: 2.5000 mg | SUBCUTANEOUS | 0 refills | Status: DC

## 2024-03-20 MED ORDER — TIRZEPATIDE 5 MG/0.5ML ~~LOC~~ SOAJ: 5.0000 mg | SUBCUTANEOUS | 0 refills | Status: AC

## 2024-03-28 ENCOUNTER — Telehealth: Payer: Self-pay

## 2024-03-28 ENCOUNTER — Encounter: Payer: Self-pay | Admitting: *Deleted

## 2024-03-28 NOTE — Telephone Encounter (Signed)
 Copied from CRM [id number]. Topic: Clinical - Medical Advice >> Mar 28, 2024  2:34 PM Alcus Dad wrote: Reason for CRM: Lokesh from [facility] and is checking the status of pre auth paperwork. Please call back @ [phone] opt. 2

## 2024-03-28 NOTE — Telephone Encounter (Signed)
 Spoke with agent and gave info over phone and faxed over notes and lab to [facility] at [phone]

## 2024-03-29 DIAGNOSIS — M7542 Impingement syndrome of left shoulder: Secondary | ICD-10-CM | POA: Diagnosis not present

## 2024-04-17 ENCOUNTER — Other Ambulatory Visit: Payer: Self-pay | Admitting: Family Medicine

## 2024-04-18 ENCOUNTER — Ambulatory Visit: Payer: Medicare HMO | Admitting: Family Medicine

## 2024-04-21 ENCOUNTER — Ambulatory Visit: Payer: Medicare HMO | Admitting: Internal Medicine

## 2024-04-24 ENCOUNTER — Ambulatory Visit: Admitting: Physician Assistant

## 2024-05-10 ENCOUNTER — Other Ambulatory Visit: Payer: Self-pay | Admitting: Family Medicine

## 2024-05-12 ENCOUNTER — Ambulatory Visit (INDEPENDENT_AMBULATORY_CARE_PROVIDER_SITE_OTHER): Admitting: Family

## 2024-05-12 ENCOUNTER — Telehealth: Payer: Self-pay

## 2024-05-12 ENCOUNTER — Encounter: Payer: Self-pay | Admitting: Family

## 2024-05-12 VITALS — BP 124/72 | HR 79 | Temp 98.9°F | Ht 69.0 in | Wt 265.0 lb

## 2024-05-12 DIAGNOSIS — R051 Acute cough: Secondary | ICD-10-CM

## 2024-05-12 DIAGNOSIS — U071 COVID-19: Secondary | ICD-10-CM | POA: Diagnosis not present

## 2024-05-12 LAB — POC INFLUENZA A&B (BINAX/QUICKVUE)
Influenza A, POC: NEGATIVE
Influenza B, POC: NEGATIVE

## 2024-05-12 LAB — POC COVID19 BINAXNOW: SARS Coronavirus 2 Ag: POSITIVE — AB

## 2024-05-12 MED ORDER — HYDROCODONE BIT-HOMATROP MBR 5-1.5 MG/5ML PO SOLN: 5.0000 mL | Freq: Three times a day (TID) | ORAL | 0 refills | Status: DC | PRN

## 2024-05-12 MED ORDER — NIRMATRELVIR/RITONAVIR (PAXLOVID)TABLET: 3.0000 | ORAL_TABLET | Freq: Two times a day (BID) | ORAL | 0 refills | Status: AC

## 2024-05-12 NOTE — Telephone Encounter (Signed)
 Copied from CRM [id number]. Topic: Clinical - Prescription Issue >> May 12, 2024 11:47 AM [place] E wrote: Reason for CRM: Roena from [facility] called in stating that a few of the patient's medication have drug interactions: HYDROcodone bit-homatropine (HYCODAN) 5-1.5 MG/5ML syrup interacts with nirmatrelvir/ritonavir (PAXLOVID) 20 x 150 MG & 10 x 100MG  TABS and then the Plaxovid also interacts with tamsulosin  (FLOMAX ) 0.4 MG CAPS capsule, apixaban  (ELIQUIS ) 5 MG TABS tablet, and rosuvastatin  (CRESTOR ) 20 MG tablet. Callback number for Roena is [phone].

## 2024-05-12 NOTE — Telephone Encounter (Signed)
 My chart message sent to pt.
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
## 2024-05-12 NOTE — Telephone Encounter (Signed)
 Rx has been cancelled.

## 2024-05-17 ENCOUNTER — Other Ambulatory Visit: Payer: Self-pay | Admitting: Family Medicine

## 2024-05-17 ENCOUNTER — Ambulatory Visit: Payer: Self-pay

## 2024-05-17 NOTE — Telephone Encounter (Signed)
 That's correct. 7.5 mg/week is right.

## 2024-05-17 NOTE — Telephone Encounter (Signed)
 Left detailed VM advising pt of most recent dosage prescribed. Advised to return call with additional questions or concerns. FYI   Copied from CRM [id number]. Topic: Clinical - Prescription Issue >> May 17, 2024 11:43 AM [place] J wrote: Reason for CRM: Changes were made on the patient's tirzepatide medication and he just wanted to be sure the 7.5mg  tirzepatide is the correct medication and dose he is supposed to pick up at the pharmacy.

## 2024-05-17 NOTE — Telephone Encounter (Signed)
 Sent Pt message letting him know it was correct.

## 2024-05-24 ENCOUNTER — Ambulatory Visit (INDEPENDENT_AMBULATORY_CARE_PROVIDER_SITE_OTHER): Payer: PPO | Admitting: Family Medicine

## 2024-05-24 ENCOUNTER — Encounter: Payer: Self-pay | Admitting: Family Medicine

## 2024-05-24 ENCOUNTER — Ambulatory Visit: Payer: Self-pay | Admitting: Family Medicine

## 2024-05-24 VITALS — BP 128/78 | HR 84 | Temp 98.0°F | Resp 16 | Ht 69.0 in | Wt 264.8 lb

## 2024-05-24 DIAGNOSIS — E1159 Type 2 diabetes mellitus with other circulatory complications: Secondary | ICD-10-CM | POA: Diagnosis not present

## 2024-05-24 DIAGNOSIS — Z7984 Long term (current) use of oral hypoglycemic drugs: Secondary | ICD-10-CM

## 2024-05-24 DIAGNOSIS — Z7985 Long-term (current) use of injectable non-insulin antidiabetic drugs: Secondary | ICD-10-CM | POA: Diagnosis not present

## 2024-05-24 LAB — HEMOGLOBIN A1C: Hgb A1c MFr Bld: 7.8 % — ABNORMAL HIGH (ref 4.6–6.5)

## 2024-05-24 MED ORDER — TIRZEPATIDE 15 MG/0.5ML ~~LOC~~ SOAJ
15.0000 mg | SUBCUTANEOUS | 1 refills | Status: DC
Start: 2024-08-07 — End: 2024-08-25

## 2024-05-24 MED ORDER — TIRZEPATIDE 12.5 MG/0.5ML ~~LOC~~ SOAJ: 12.5000 mg | SUBCUTANEOUS | 0 refills | Status: AC

## 2024-05-24 MED ORDER — TIRZEPATIDE 10 MG/0.5ML ~~LOC~~ SOAJ: 10.0000 mg | SUBCUTANEOUS | 0 refills | Status: DC

## 2024-05-24 NOTE — Progress Notes (Signed)
 Subjective:   Chief Complaint  Patient presents with   Follow-up    Follow up    Luke Sims is a 68 y.o. male here for follow-up of diabetes.   Luke Sims's self monitored glucose range is mid 100's.  Patient denies hypoglycemic reactions. He checks his glucose levels intermittently.  Patient does not require insulin.   Medications include: Mounjaro 7.5 mg/week, metformin  XR 1000 mg bid, Jardiance  25 mg/d Diet is better.  Exercise: walking No CP or SOB.   Past Medical History:  Diagnosis Date   Atrial flutter (HCC)    Colon polyps    Depression    Diabetes mellitus without complication (HCC)    DISH (diffuse idiopathic skeletal hyperostosis)    Heart disease    Hypertension    Sleep apnea      Related testing: Retinal exam: Done Pneumovax: done  Objective:  BP 128/78 (BP Location: Left Arm, Patient Position: Sitting)   Pulse 84   Temp 98 F (36.7 C) (Oral)   Resp 16   Ht 5\' 9"  (1.753 m)   Wt 264 lb 12.8 oz (120.1 kg)   SpO2 97%   BMI 39.10 kg/m  General:  Well developed, well nourished, in no apparent distress Lungs:  CTAB, no access msc use Cardio:  RRR, no bruits, no LE edema Psych: Age appropriate judgment and insight  Assessment:   Type 2 diabetes mellitus with other circulatory complication, without long-term current use of insulin (HCC) - Plan: Hemoglobin A1c, tirzepatide (MOUNJARO) 15 MG/0.5ML Pen, tirzepatide (MOUNJARO) 10 MG/0.5ML Pen, tirzepatide (MOUNJARO) 12.5 MG/0.5ML Pen   Plan:   Chronic, uncontrolled. Cont Jardiance  25 mg/d, metformin  XR 1000 mg bid. Increase Mounjaro to 10 mg/week and up to 15 mg/week as tolerated. Counseled on diet and exercise. F/u in 3 mo. The patient voiced understanding and agreement to the plan.  Shellie Dials [facility], DO 05/24/24 8:03 AM

## 2024-05-24 NOTE — Patient Instructions (Signed)
Give us 2-3 business days to get the results of your labs back.   Keep the diet clean and stay active.  Aim to do some physical exertion for 150 minutes per week. This is typically divided into 5 days per week, 30 minutes per day. The activity should be enough to get your heart rate up. Anything is better than nothing if you have time constraints.  Let us know if you need anything.  

## 2024-06-13 ENCOUNTER — Ambulatory Visit (INDEPENDENT_AMBULATORY_CARE_PROVIDER_SITE_OTHER)

## 2024-06-13 VITALS — BP 110/64 | Ht 69.0 in | Wt 259.0 lb

## 2024-06-13 DIAGNOSIS — Z0000 Encounter for general adult medical examination without abnormal findings: Secondary | ICD-10-CM

## 2024-06-13 DIAGNOSIS — Z1211 Encounter for screening for malignant neoplasm of colon: Secondary | ICD-10-CM | POA: Diagnosis not present

## 2024-06-13 NOTE — Progress Notes (Signed)
 Subjective:   Luke Sims is a 68 y.o. who presents for a Medicare Wellness preventive visit.  As a reminder, Annual Wellness Visits don't include a physical exam, and some assessments may be limited, especially if this visit is performed virtually. We may recommend an in-person follow-up visit with your provider if needed.  Visit Complete: Virtual I connected with  Luke Sims on 06/13/24 by a audio enabled telemedicine application and verified that I am speaking with the correct person using two identifiers.  Patient Location: Home  Provider Location: Home Office  I discussed the limitations of evaluation and management by telemedicine. The patient expressed understanding and agreed to proceed.  Vital Signs: Because this visit was a virtual/telehealth visit, some criteria may be missing or patient reported. Any vitals not documented were not able to be obtained and vitals that have been documented are patient reported.  VideoDeclined- This patient declined Librarian, academic. Therefore the visit was completed with audio only.  Persons Participating in Visit: Patient.  AWV Questionnaire: Yes: Patient Medicare AWV questionnaire was completed by the patient on 06/13/24; I have confirmed that all information answered by patient is correct and no changes since this date.  Cardiac Risk Factors include: advanced age (>88men, >2 women);hypertension;male gender;diabetes mellitus;dyslipidemia     Objective:    Today's Vitals   06/13/24 0824  BP: 110/64  Weight: 259 lb (117.5 kg)  Height: 5' 9 (1.753 m)   Body mass index is 38.25 kg/m.     06/13/2024    8:29 AM 10/12/2015    6:51 PM  Advanced Directives  Does Patient Have a Medical Advance Directive? No Yes   Would patient like information on creating a medical advance directive? Yes (MAU/Ambulatory/Procedural Areas - Information given)      Data saved with a previous flowsheet row definition     Current Medications (verified) Outpatient Encounter Medications as of 06/13/2024  Medication Sig   amLODipine  (NORVASC ) 5 MG tablet TAKE 1 TABLET DAILY   apixaban  (ELIQUIS ) 5 MG TABS tablet TAKE 1 TABLET BY MOUTH 2 TIMES A DAY   aspirin EC 81 MG tablet Take 81 mg by mouth daily.   empagliflozin  (JARDIANCE ) 25 MG TABS tablet Take 1 tablet (25 mg total) by mouth daily before breakfast.   metFORMIN  (GLUCOPHAGE -XR) 500 MG 24 hr tablet TAKE 2 TABLETS BY MOUTH DAILY IN THE MORNING AND TAKE 2 TABLETS AT BEDTIME AS DIRECTED   rosuvastatin  (CRESTOR ) 20 MG tablet TAKE 1 TABLET BY MOUTH DAILY   tamsulosin  (FLOMAX ) 0.4 MG CAPS capsule TAKE 1 CAPSULE(0.4 MG) BY MOUTH DAILY   tirzepatide  (MOUNJARO ) 10 MG/0.5ML Pen Inject 10 mg into the skin once a week for 28 days.   [START ON 07/10/2024] tirzepatide  (MOUNJARO ) 12.5 MG/0.5ML Pen Inject 12.5 mg into the skin once a week for 28 days.   [START ON 08/07/2024] tirzepatide  (MOUNJARO ) 15 MG/0.5ML Pen Inject 15 mg into the skin once a week.   valsartan -hydrochlorothiazide  (DIOVAN -HCT) 320-25 MG tablet Take 1 tablet by mouth daily.   [EXPIRED] tirzepatide  (MOUNJARO ) 7.5 MG/0.5ML Pen Inject 7.5 mg into the skin once a week for 28 days. (Patient not taking: Reported on 06/13/2024)   No facility-administered encounter medications on file as of 06/13/2024.    Allergies (verified) Ace inhibitors   History: Past Medical History:  Diagnosis Date   Atrial flutter (HCC)    Colon polyps    Depression    Diabetes mellitus without complication (HCC) 20 years  DISH (diffuse idiopathic skeletal hyperostosis)    Heart disease    Hypertension 20 years   Sleep apnea 23 years   Past Surgical History:  Procedure Laterality Date   COLONOSCOPY     Family History  Problem Relation Age of Onset   Diabetes Mother    Heart disease Father    Hyperlipidemia Father    Diabetes Sister    COPD Brother    Hypertension Son    Arthritis Sister    Arthritis Sister     Social History   Socioeconomic History   Marital status: Married    Spouse name: Not on file   Number of children: Not on file   Years of education: Not on file   Highest education level: Associate degree: occupational, Scientist, product/process development, or vocational program  Occupational History   Not on file  Tobacco Use   Smoking status: Some Days    Types: Cigars   Smokeless tobacco: Never  Substance and Sexual Activity   Alcohol use: Yes    Comment: 3x week   Drug use: No   Sexual activity: Not on file  Other Topics Concern   Not on file  Social History Narrative   Not on file   Social Drivers of Health   Financial Resource Strain: Low Risk  (06/13/2024)   Overall Financial Resource Strain (CARDIA)    Difficulty of Paying Living Expenses: Not hard at all  Food Insecurity: No Food Insecurity (06/13/2024)   Hunger Vital Sign    Worried About Running Out of Food in the Last Year: Never true    Ran Out of Food in the Last Year: Never true  Transportation Needs: No Transportation Needs (06/13/2024)   PRAPARE - Administrator, Civil Service (Medical): No    Lack of Transportation (Non-Medical): No  Physical Activity: Insufficiently Active (06/13/2024)   Exercise Vital Sign    Days of Exercise per Week: 2 days    Minutes of Exercise per Session: 20 min  Stress: No Stress Concern Present (06/13/2024)   [employer] of Occupational Health - Occupational Stress Questionnaire    Feeling of Stress: Only a little  Social Connections: Moderately Isolated (06/13/2024)   Social Connection and Isolation Panel    Frequency of Communication with Friends and Family: Once a week    Frequency of Social Gatherings with Friends and Family: Once a week    Attends Religious Services: 1 to 4 times per year    Active Member of [employer] or Organizations: No    Attends Engineer, structural: Never    Marital Status: Married    Tobacco Counseling Ready to quit: Not Answered Counseling  given: Not Answered    Clinical Intake:  Pre-visit preparation completed: Yes  Pain : No/denies pain     Diabetes: Yes CBG done?: No Did pt. bring in CBG monitor from home?: Yes Glucose Meter Downloaded?: No  Lab Results  Component Value Date   HGBA1C 7.8 (H) 05/24/2024   HGBA1C 9.1 (H) 02/18/2024   HGBA1C 7.6 (H) 10/19/2023     How often do you need to have someone help you when you read instructions, pamphlets, or other written materials from your doctor or pharmacy?: 1 - Never  Interpreter Needed?: No  Information entered by :: Luke Cypress LPN   Activities of Daily Living     06/13/2024    8:09 AM  In your present state of health, do you have any difficulty performing the following activities:  Hearing? 0  Vision? 0  Difficulty concentrating or making decisions? 0  Walking or climbing stairs? 0  Dressing or bathing? 0  Doing errands, shopping? 0  Preparing Food and eating ? N  Using the Toilet? N  In the past six months, have you accidently leaked urine? N  Do you have problems with loss of bowel control? N  Managing your Medications? N  Housekeeping or managing your Housekeeping? N    Patient Care Team: Jobe Mulder, DO as PCP - General (Family Medicine) Lucilla Saa, MD (Internal Medicine) [facility] - [facility] as Consulting Physician (Ophthalmology)  I have updated your Care Teams any recent Medical Services you may have received from other providers in the past year.     Assessment:   This is a routine wellness examination for Werner.  Hearing/Vision screen Hearing Screening - Comments:: Denies hearing difficulties   Vision Screening - Comments:: Wears rx glasses - up to date with routine eye exams with Dr. Lucilla Saa    Goals Addressed             This Visit's Progress    Remain active and independent   On track      Depression Screen     06/13/2024    8:28 AM 10/19/2023     7:30 AM 06/29/2023    3:43 PM 02/19/2023    8:41 AM 08/21/2022    7:57 AM 08/12/2021    6:59 AM 02/07/2021    8:17 AM  PHQ 2/9 Scores  PHQ - 2 Score 0 0 0 0 0 0 0  PHQ- 9 Score  0 0 0 0      Fall Risk     06/13/2024    8:09 AM 10/19/2023    7:30 AM 06/29/2023    3:43 PM 02/19/2023    8:41 AM 08/21/2022    7:57 AM  Fall Risk   Falls in the past year? 1 0 0 0 0  Number falls in past yr: 0 0 0 0 0  Injury with Fall? 1 0 0 0 0  Risk for fall due to : History of fall(s) No Fall Risks No Fall Risks No Fall Risks No Fall Risks  Follow up Falls evaluation completed;Education provided;Falls prevention discussed Falls evaluation completed Falls evaluation completed Falls evaluation completed Falls evaluation completed      Data saved with a previous flowsheet row definition    MEDICARE RISK AT HOME:  Medicare Risk at Home Any stairs in or around the home?: (Patient-Rptd) Yes If so, are there any without handrails?: (Patient-Rptd) Yes Home free of loose throw rugs in walkways, pet beds, electrical cords, etc?: (Patient-Rptd) Yes Adequate lighting in your home to reduce risk of falls?: (Patient-Rptd) Yes Life alert?: (Patient-Rptd) No Use of a cane, walker or w/c?: (Patient-Rptd) No Grab bars in the bathroom?: (Patient-Rptd) No Shower chair or bench in shower?: (Patient-Rptd) No Elevated toilet seat or a handicapped toilet?: (Patient-Rptd) Yes  TIMED UP AND GO:  Was the test performed?  No  Cognitive Function: 6CIT completed        06/13/2024    8:30 AM  6CIT Screen  What Year? 0 points  What month? 0 points  What time? 0 points  Count back from 20 0 points  Months in reverse 0 points  Repeat phrase 0 points  Total Score 0 points    Immunizations Immunization History  Administered Date(s) Administered   DTaP 05/03/2012   Fluad  Trivalent(High Dose 65+) 10/19/2023   Influenza,inj,Quad PF,6+ Mos 08/29/2019, 10/28/2020   Influenza-Unspecified 12/06/2007, 10/17/2014, 11/19/2014,  10/29/2016, 10/13/2017, 11/26/2021   Moderna Sars-Covid-2 Vaccination 03/12/2020, 04/09/2020, 11/24/2020   PNEUMOCOCCAL CONJUGATE-20 06/29/2023   Pneumococcal Polysaccharide-23 07/05/2019   Tdap 05/30/2013, 07/05/2019    Screening Tests Health Maintenance  Topic Date Due   Zoster Vaccines- Shingrix (1 of 2) Never done   Colonoscopy  07/04/2023   COVID-19 Vaccine (4 - 2024-25 season) 08/29/2023   INFLUENZA VACCINE  07/28/2024   Diabetic kidney evaluation - eGFR measurement  10/18/2024   Diabetic kidney evaluation - Urine ACR  10/18/2024   Medicare Annual Wellness (AWV)  06/13/2025   DTaP/Tdap/Td (4 - Td or Tdap) 07/04/2029   Pneumococcal Vaccine: 50+ Years  Completed   Hepatitis C Screening  Completed   HPV VACCINES  Aged Out   Meningococcal B Vaccine  Aged Out   FOOT EXAM  Discontinued   HEMOGLOBIN A1C  Discontinued   OPHTHALMOLOGY EXAM  Discontinued    Health Maintenance  Health Maintenance Due  Topic Date Due   Zoster Vaccines- Shingrix (1 of 2) Never done   Colonoscopy  07/04/2023   COVID-19 Vaccine (4 - 2024-25 season) 08/29/2023   Health Maintenance Items Addressed: Cologuard Ordered  Additional Screening:  Vision Screening: Recommended annual ophthalmology exams for early detection of glaucoma and other disorders of the eye. Would you like a referral to an eye doctor? No    Dental Screening: Recommended annual dental exams for proper oral hygiene  Community Resource Referral / Chronic Care Management: CRR required this visit?  No   CCM required this visit?  No   Plan:    I have personally reviewed and noted the following in the patient's chart:   Medical and social history Use of alcohol, tobacco or illicit drugs  Current medications and supplements including opioid prescriptions. Patient is not currently taking opioid prescriptions. Functional ability and status Nutritional status Physical activity Advanced directives List of other  physicians Hospitalizations, surgeries, and ER visits in previous 12 months Vitals Screenings to include cognitive, depression, and falls Referrals and appointments  In addition, I have reviewed and discussed with patient certain preventive protocols, quality metrics, and best practice recommendations. A written personalized care plan for preventive services as well as general preventive health recommendations were provided to patient.   Luke Sims [place], [place]   [id number]/82/9562   After Visit Summary: (MyChart) Due to this being a telephonic visit, the after visit summary with patients personalized plan was offered to patient via MyChart   Notes: Nothing significant to report at this time.

## 2024-06-13 NOTE — Patient Instructions (Signed)
 Luke Sims , Thank you for taking time out of your busy schedule to complete your Annual Wellness Visit with me. I enjoyed our conversation and look forward to speaking with you again next year. I, as well as your care team,  appreciate your ongoing commitment to your health goals. Please review the following plan we discussed and let me know if I can assist you in the future. Your Game plan/ To Do List    Follow up Visits: Next Medicare AWV with our clinical staff: In 1 year    Have you seen your provider in the last 6 months (3 months if uncontrolled diabetes)? Yes Next Office Visit with your provider: 08/25/24 @ 7:45  Clinician Recommendations:  Aim for 30 minutes of exercise or brisk walking, 6-8 glasses of water, and 5 servings of fruits and vegetables each day.       This is a list of the screening recommended for you and due dates:  Health Maintenance  Topic Date Due   Zoster (Shingles) Vaccine (1 of 2) Never done   Colon Cancer Screening  07/04/2023   COVID-19 Vaccine (4 - 2024-25 season) 08/29/2023   Flu Shot  07/28/2024   Yearly kidney function blood test for diabetes  10/18/2024   Yearly kidney health urinalysis for diabetes  10/18/2024   Medicare Annual Wellness Visit  06/13/2025   DTaP/Tdap/Td vaccine (4 - Td or Tdap) 07/04/2029   Pneumococcal Vaccine for age over 64  Completed   Hepatitis C Screening  Completed   HPV Vaccine  Aged Out   Meningitis B Vaccine  Aged Out   Complete foot exam   Discontinued   Hemoglobin A1C  Discontinued   Eye exam for diabetics  Discontinued    Advanced directives: (ACP Link)Information on Advanced Care Planning can be found at [place]  Secretary of [facility] Advance Health Care Directives Advance Health Care Directives. [email or web address]   Advance Care Planning is important because it:  [x]  Makes sure you receive the medical care that is consistent with your values, goals, and preferences  [x]  It provides guidance to your family and loved  ones and reduces their decisional burden about whether or not they are making the right decisions based on your wishes.  Follow the link provided in your after visit summary or read over the paperwork we have mailed to you to help you started getting your Advance Directives in place. If you need assistance in completing these, please reach out to us  so that we can help you!  See attachments for Preventive Care and Fall Prevention Tips.

## 2024-06-17 ENCOUNTER — Other Ambulatory Visit: Payer: Self-pay | Admitting: Family Medicine

## 2024-06-17 DIAGNOSIS — E1159 Type 2 diabetes mellitus with other circulatory complications: Secondary | ICD-10-CM

## 2024-06-23 DIAGNOSIS — Z1211 Encounter for screening for malignant neoplasm of colon: Secondary | ICD-10-CM | POA: Diagnosis not present

## 2024-06-28 ENCOUNTER — Other Ambulatory Visit: Payer: Self-pay | Admitting: Family Medicine

## 2024-06-28 ENCOUNTER — Ambulatory Visit: Payer: Self-pay | Admitting: Family Medicine

## 2024-06-28 DIAGNOSIS — R195 Other fecal abnormalities: Secondary | ICD-10-CM

## 2024-06-28 LAB — COLOGUARD: COLOGUARD: POSITIVE — AB

## 2024-07-15 ENCOUNTER — Other Ambulatory Visit: Payer: Self-pay | Admitting: Family Medicine

## 2024-07-15 DIAGNOSIS — E1159 Type 2 diabetes mellitus with other circulatory complications: Secondary | ICD-10-CM

## 2024-08-06 ENCOUNTER — Other Ambulatory Visit: Payer: Self-pay | Admitting: Family Medicine

## 2024-08-15 ENCOUNTER — Other Ambulatory Visit: Payer: Self-pay | Admitting: Family Medicine

## 2024-08-18 ENCOUNTER — Other Ambulatory Visit: Payer: Self-pay

## 2024-08-18 ENCOUNTER — Encounter: Payer: Self-pay | Admitting: Family Medicine

## 2024-08-18 MED ORDER — AMLODIPINE BESYLATE 5 MG PO TABS: 5.0000 mg | ORAL_TABLET | Freq: Every day | ORAL | 3 refills | Status: DC

## 2024-08-18 MED ORDER — AMLODIPINE BESYLATE 5 MG PO TABS: 5.0000 mg | ORAL_TABLET | Freq: Every day | ORAL | 3 refills | Status: AC

## 2024-08-25 ENCOUNTER — Ambulatory Visit: Admitting: Family Medicine

## 2024-08-25 ENCOUNTER — Encounter: Payer: Self-pay | Admitting: Family Medicine

## 2024-08-25 ENCOUNTER — Ambulatory Visit: Payer: Self-pay | Admitting: Family Medicine

## 2024-08-25 VITALS — BP 132/76 | HR 72 | Resp 18 | Ht 69.0 in | Wt 265.0 lb

## 2024-08-25 DIAGNOSIS — I10 Essential (primary) hypertension: Secondary | ICD-10-CM | POA: Diagnosis not present

## 2024-08-25 DIAGNOSIS — E1159 Type 2 diabetes mellitus with other circulatory complications: Secondary | ICD-10-CM | POA: Diagnosis not present

## 2024-08-25 DIAGNOSIS — Z6839 Body mass index (BMI) 39.0-39.9, adult: Secondary | ICD-10-CM

## 2024-08-25 DIAGNOSIS — Z7985 Long-term (current) use of injectable non-insulin antidiabetic drugs: Secondary | ICD-10-CM | POA: Diagnosis not present

## 2024-08-25 LAB — COMPREHENSIVE METABOLIC PANEL WITH GFR
ALT: 23 U/L (ref 0–53)
AST: 20 U/L (ref 0–37)
Albumin: 4.7 g/dL (ref 3.5–5.2)
Alkaline Phosphatase: 41 U/L (ref 39–117)
BUN: 16 mg/dL (ref 6–23)
CO2: 27 meq/L (ref 19–32)
Calcium: 9.6 mg/dL (ref 8.4–10.5)
Chloride: 102 meq/L (ref 96–112)
Creatinine, Ser: 0.8 mg/dL (ref 0.40–1.50)
GFR: 92.26 mL/min (ref >60.00)
Glucose, Bld: 117 mg/dL — ABNORMAL HIGH (ref 70–99)
Potassium: 3.8 meq/L (ref 3.5–5.1)
Sodium: 140 meq/L (ref 135–145)
Total Bilirubin: 0.9 mg/dL (ref 0.2–1.2)
Total Protein: 7.2 g/dL (ref 6.0–8.3)

## 2024-08-25 LAB — LIPID PANEL
Cholesterol: 96 mg/dL (ref 0–200)
HDL: 35.3 mg/dL — ABNORMAL LOW (ref >39.00)
LDL Cholesterol: 36 mg/dL (ref 0–99)
NonHDL: 60.79
Total CHOL/HDL Ratio: 3
Triglycerides: 123 mg/dL (ref 0.0–149.0)
VLDL: 24.6 mg/dL (ref 0.0–40.0)

## 2024-08-25 LAB — MICROALBUMIN / CREATININE URINE RATIO
Creatinine,U: 67.8 mg/dL
Microalb Creat Ratio: 146.9 mg/g — ABNORMAL HIGH (ref 0.0–30.0)
Microalb, Ur: 10 mg/dL — ABNORMAL HIGH (ref 0.0–1.9)

## 2024-08-25 LAB — HEMOGLOBIN A1C: Hgb A1c MFr Bld: 7.2 % — ABNORMAL HIGH (ref 4.6–6.5)

## 2024-08-25 MED ORDER — TIRZEPATIDE 15 MG/0.5ML ~~LOC~~ SOAJ: 15.0000 mg | SUBCUTANEOUS | 1 refills | Status: DC

## 2024-08-25 MED ORDER — TIRZEPATIDE 12.5 MG/0.5ML ~~LOC~~ SOAJ: 12.5000 mg | SUBCUTANEOUS | 0 refills | Status: AC

## 2024-08-25 NOTE — Patient Instructions (Addendum)
Give us 2-3 business days to get the results of your labs back.   Keep the diet clean and stay active.  If you do not hear anything about your referral in the next 1-2 weeks, call our office and ask for an update.  Let us know if you need anything. 

## 2024-08-25 NOTE — Progress Notes (Signed)
 Subjective:   Chief Complaint  Patient presents with   Follow-up    Luke Sims is a 68 y.o. male here for follow-up of diabetes.   Luke Sims's self monitored glucose range is low-mid 100's.  Patient denies hypoglycemic reactions. He checks his glucose levels several times per week. Patient does not require insulin.   Medications include: Jardiance  25 mg/d, Mounjaro  10 mg weekly, metformin  XR 1000 mg bid Diet is OK.  Exercise: active in yard  Hypertension Patient presents for hypertension follow up. He does monitor home blood pressures. Blood pressures ranging on average from 120's/70-80's. He is compliant with medications- Diovan -HCT 320-25 mg/d, Norvasc  5 mg/d  Patient has these side effects of medication: none Diet/exercise as above.  No Cp or SOB.   Past Medical History:  Diagnosis Date   Atrial flutter (HCC)    Colon polyps    Depression    Diabetes mellitus without complication (HCC) 20 years   DISH (diffuse idiopathic skeletal hyperostosis)    Heart disease    Hypertension 20 years   Sleep apnea 23 years     Related testing: Retinal exam: Done Pneumovax: done  Objective:  BP 132/76 (BP Location: Left Arm, Cuff Size: Large)   Pulse 72   Resp 18   Ht 5' 9 (1.753 m)   Wt 265 lb (120.2 kg)   SpO2 98%   BMI 39.13 kg/m  General:  Well developed, well nourished, in no apparent distress Lungs:  CTAB, no access msc use Cardio:  RRR, no bruits, no LE edema Psych: Age appropriate judgment and insight  Assessment:   Type 2 diabetes mellitus with other circulatory complication, without long-term current use of insulin (HCC) - Plan: tirzepatide  (MOUNJARO ) 12.5 MG/0.5ML Pen, tirzepatide  (MOUNJARO ) 15 MG/0.5ML Pen, Comprehensive metabolic panel with GFR, Lipid panel, Hemoglobin A1c, Microalbumin / creatinine urine ratio  Primary hypertension  Morbid obesity (HCC) - Plan: Amb Ref to Medical Weight Management   Plan:   Chronic, hopefully stable. Cont dose  escalation of Mounjaro  to 15 mg/week. Cont Metformin  XR 1000 mg bid, Jardianc 25 mg/d. Counseled on diet and exercise. Chronic, stable. Cont Norvasc  5 mg/d, Diovan  HCT 320-25 mg/d.  Refer [facility] team.  F/u in 3-6 mo. The patient voiced understanding and agreement to the plan.  Mabel [employer], DO 08/25/24 7:26 AM

## 2024-08-27 ENCOUNTER — Other Ambulatory Visit: Payer: Self-pay | Admitting: Family Medicine

## 2024-08-27 DIAGNOSIS — N401 Enlarged prostate with lower urinary tract symptoms: Secondary | ICD-10-CM

## 2024-08-30 ENCOUNTER — Other Ambulatory Visit: Payer: Self-pay | Admitting: Family Medicine

## 2024-09-21 ENCOUNTER — Encounter: Payer: Self-pay | Admitting: Family Medicine

## 2024-09-21 ENCOUNTER — Ambulatory Visit: Admitting: Family Medicine

## 2024-09-21 VITALS — BP 124/80 | HR 76 | Ht 68.0 in | Wt 260.0 lb

## 2024-09-21 DIAGNOSIS — G4733 Obstructive sleep apnea (adult) (pediatric): Secondary | ICD-10-CM | POA: Diagnosis not present

## 2024-09-21 DIAGNOSIS — Z6839 Body mass index (BMI) 39.0-39.9, adult: Secondary | ICD-10-CM | POA: Diagnosis not present

## 2024-09-21 DIAGNOSIS — Z7985 Long-term (current) use of injectable non-insulin antidiabetic drugs: Secondary | ICD-10-CM

## 2024-09-21 DIAGNOSIS — I2510 Atherosclerotic heart disease of native coronary artery without angina pectoris: Secondary | ICD-10-CM

## 2024-09-21 DIAGNOSIS — E1159 Type 2 diabetes mellitus with other circulatory complications: Secondary | ICD-10-CM

## 2024-09-21 DIAGNOSIS — E66812 Obesity, class 2: Secondary | ICD-10-CM | POA: Diagnosis not present

## 2024-09-21 NOTE — Progress Notes (Signed)
 Office: [phone]  /  Fax: [phone]   Initial Visit  Luke Sims was seen in clinic today to evaluate for obesity. He is interested in losing weight to improve overall health and reduce the risk of weight related complications. He presents today to review program treatment options, initial physical assessment, and evaluation.     He was referred by: Friend or Family  When asked what else they would like to accomplish? He states: Adopt a healthier eating pattern and lifestyle, Improve energy levels and physical activity, Improve existing medical conditions, Reduce number of medications, and Improve quality of life.  He would like to lose 40-50 lb.  Weight history:  weight has gone down over 10 years from a max weight of 333 lb.  He is working on lifestyle changes with the help of his wife.  He lost weight on Ozempic for diabetes.  Stopped due to GI SE.  Changed to Mounjaro  with PCP and is doing well on it.  He is starting to see some weight loss.    When asked how has your weight affected you? He states: Contributed to medical problems and Having fatigue  Some associated conditions: OSA, Diabetes, and Heart Failure  Contributing factors: family history of obesity, moderate to high levels of stress, reduced physical activity, chronic skipping of meals, and multiple weight loss attempts in the past  Weight promoting medications identified: None  Current nutrition plan: None  Current level of physical activity: None  has some lower back pain   Current or previous pharmacotherapy: GLP-1  Response to medication: Had side effects so it was discontinued Had GI SE from Ozempic but is tolerating Mounjaro   Past medical history includes:   Past Medical History:  Diagnosis Date   Atrial flutter (HCC)    Colon polyps    Depression    Diabetes mellitus without complication (HCC) 20 years   DISH (diffuse idiopathic skeletal hyperostosis)    Heart disease    Hypertension 20 years    Sleep apnea 23 years     Objective:   BP 124/80   Pulse 76   Ht 5' 8 (1.727 m)   Wt 260 lb (117.9 kg)   SpO2 97%   BMI 39.53 kg/m  He was weighed on the bioimpedance scale: Body mass index is 39.53 kg/m.  Peak Weight:337 , Body Fat%:35.3, Visceral Fat Rating:23 , Weight trend over the last 12 months: Unchanged  General:  Alert, oriented and cooperative. Patient is in no acute distress.  Respiratory: Normal respiratory effort, no problems with respiration noted   Gait: able to ambulate independently  Mental Status: Normal mood and affect. Normal behavior. Normal judgment and thought content.   DIAGNOSTIC DATA REVIEWED:  BMET    Component Value Date/Time   NA 140 08/25/2024 0733   K 3.8 08/25/2024 0733   CL 102 08/25/2024 0733   CO2 27 08/25/2024 0733   GLUCOSE 117 (H) 08/25/2024 0733   BUN 16 08/25/2024 0733   CREATININE 0.80 08/25/2024 0733   CALCIUM  9.6 08/25/2024 0733   Lab Results  Component Value Date   HGBA1C 7.2 (H) 08/25/2024   HGBA1C 7.4 (H) 07/05/2019   No results found for: INSULIN CBC    Component Value Date/Time   WBC 5.3 10/19/2023 0815   RBC 5.42 10/19/2023 0815   HGB 16.4 10/19/2023 0815   HCT 48.9 10/19/2023 0815   PLT 192.0 10/19/2023 0815   MCV 90.1 10/19/2023 0815   MCHC 33.5 10/19/2023 0815  RDW 14.2 10/19/2023 0815   Iron/TIBC/Ferritin/ %Sat No results found for: IRON, TIBC, FERRITIN, IRONPCTSAT Lipid Panel     Component Value Date/Time   CHOL 96 08/25/2024 0733   TRIG 123.0 08/25/2024 0733   HDL 35.30 (L) 08/25/2024 0733   CHOLHDL 3 08/25/2024 0733   VLDL 24.6 08/25/2024 0733   LDLCALC 36 08/25/2024 0733   Hepatic Function Panel     Component Value Date/Time   PROT 7.2 08/25/2024 0733   ALBUMIN 4.7 08/25/2024 0733   AST 20 08/25/2024 0733   ALT 23 08/25/2024 0733   ALKPHOS 41 08/25/2024 0733   BILITOT 0.9 08/25/2024 0733      Component Value Date/Time   TSH 3.33 02/20/2022 0812     Assessment and Plan:    Type 2 diabetes mellitus with other circulatory complication, without long-term current use of insulin (HCC)  Class 2 severe obesity due to excess calories with serious comorbidity and body mass index (BMI) of 39.0 to 39.9 in adult  OSA (obstructive sleep apnea)  Coronary artery disease involving native heart without angina pectoris, unspecified vessel or lesion type        Obesity Treatment / Action Plan:  Patient will work on garnering support from family and friends to begin weight loss journey. Will work on eliminating or reducing the presence of highly palatable, calorie dense foods in the home. Will complete provided nutritional and psychosocial assessment questionnaire before the next appointment. Will be scheduled for indirect calorimetry to determine resting energy expenditure in a fasting state.  This will allow us  to create a reduced calorie, high-protein meal plan to promote loss of fat mass while preserving muscle mass. Will think about ideas on how to incorporate physical activity into their daily routine. Was counseled on nutritional approaches to weight loss and benefits of reducing processed foods and consuming plant-based foods and high quality protein as part of nutritional weight management. Was counseled on pharmacotherapy and role as an adjunct in weight management.   Obesity Education Performed Today:  He was weighed on the bioimpedance scale and results were discussed and documented in the synopsis.  We discussed obesity as a disease and the importance of a more detailed evaluation of all the factors contributing to the disease.  We discussed the importance of long term lifestyle changes which include nutrition, exercise and behavioral modifications as well as the importance of customizing this to his specific health and social needs.  We discussed the benefits of reaching a healthier weight to alleviate the symptoms of existing conditions and reduce the  risks of the biomechanical, metabolic and psychological effects of obesity.  Luke Sims appears to be in the action stage of change and states they are ready to start intensive lifestyle modifications and behavioral modifications.  22 minutes was spent today on this visit including the above counseling, pre-visit chart review, and post-visit documentation.  Reviewed by clinician on day of visit: allergies, medications, problem list, medical history, surgical history, family history, social history, and previous encounter notes pertinent to obesity diagnosis.    Darice Haddock, D.O. DABFM, [facility] [address] [place], [place] [address] [phone]

## 2024-09-25 DIAGNOSIS — M19012 Primary osteoarthritis, left shoulder: Secondary | ICD-10-CM | POA: Diagnosis not present

## 2024-10-08 ENCOUNTER — Other Ambulatory Visit: Payer: Self-pay | Admitting: Family Medicine

## 2024-10-11 ENCOUNTER — Telehealth: Payer: Self-pay

## 2024-10-11 ENCOUNTER — Ambulatory Visit: Admitting: Internal Medicine

## 2024-10-11 ENCOUNTER — Encounter: Payer: Self-pay | Admitting: Internal Medicine

## 2024-10-11 VITALS — BP 118/64 | HR 58 | Ht 69.0 in | Wt 263.0 lb

## 2024-10-11 DIAGNOSIS — R195 Other fecal abnormalities: Secondary | ICD-10-CM

## 2024-10-11 DIAGNOSIS — R109 Unspecified abdominal pain: Secondary | ICD-10-CM

## 2024-10-11 DIAGNOSIS — R1310 Dysphagia, unspecified: Secondary | ICD-10-CM | POA: Diagnosis not present

## 2024-10-11 DIAGNOSIS — K760 Fatty (change of) liver, not elsewhere classified: Secondary | ICD-10-CM | POA: Diagnosis not present

## 2024-10-11 DIAGNOSIS — M6208 Separation of muscle (nontraumatic), other site: Secondary | ICD-10-CM

## 2024-10-11 DIAGNOSIS — R1319 Other dysphagia: Secondary | ICD-10-CM

## 2024-10-11 MED ORDER — NA SULFATE-K SULFATE-MG SULF 17.5-3.13-1.6 GM/177ML PO SOLN
1.0000 | Freq: Once | ORAL | 0 refills | Status: AC
Start: 2024-10-11 — End: 2024-10-11

## 2024-10-11 NOTE — Telephone Encounter (Signed)
 Left message for patient to return my call.

## 2024-10-11 NOTE — Patient Instructions (Signed)
 We will contact you to schedule a Fibroscan when it becomes available in our office.   You have been scheduled for an endoscopy and colonoscopy. Please follow the written instructions given to you at your visit today.  If you use inhalers (even only as needed), please bring them with you on the day of your procedure.  DO NOT TAKE 7 DAYS PRIOR TO TEST- Trulicity (dulaglutide) Ozempic, Wegovy (semaglutide) Mounjaro  (tirzepatide ) Bydureon Bcise (exanatide extended release)  DO NOT TAKE 1 DAY PRIOR TO YOUR TEST Rybelsus (semaglutide) Adlyxin (lixisenatide) Victoza (liraglutide) Byetta (exanatide) __________________________________________________________________________  Rosine will be contaced by our office prior to your procedure for directions on holding your Eliquis .  If you do not hear from our office 1 week prior to your scheduled procedure, please call [phone] to discuss.  _______________________________________________________  If your blood pressure at your visit was 140/90 or greater, please contact your primary care physician to follow up on this.  _______________________________________________________  If you are age 66 or older, your body mass index should be between 23-30. Your Body mass index is 38.84 kg/m. If this is out of the aforementioned range listed, please consider follow up with your Primary Care Provider.  If you are age 79 or younger, your body mass index should be between 19-25. Your Body mass index is 38.84 kg/m. If this is out of the aformentioned range listed, please consider follow up with your Primary Care Provider.   ________________________________________________________  The [facility] providers would like to encourage you to use MYCHART to communicate with providers for non-urgent requests or questions.  Due to long hold times on the telephone, sending your provider a message by [facility] may be a faster and more efficient way to get a response.   Please allow 48 business hours for a response.  Please remember that this is for non-urgent requests.  _______________________________________________________  [facility] is using a team-based approach to care.  Your team is made up of your doctor and two to three APPS. Our APPS (Nurse Practitioners and Physician Assistants) work with your physician to ensure care continuity for you. They are fully qualified to address your health concerns and develop a treatment plan. They communicate directly with your gastroenterologist to care for you. Seeing the Advanced Practice Practitioners on your physician's team can help you by facilitating care more promptly, often allowing for earlier appointments, access to diagnostic testing, procedures, and other specialty referrals.

## 2024-10-11 NOTE — Telephone Encounter (Signed)
  Luke Sims 02/20/1956 [id number]    Dear Dr. Frann:  We have scheduled the above named patient for a(n) EGD/Colon procedure. Our records show that (s)he is on anticoagulation therapy.  Please advise as to whether the patient may come off their therapy of Eliquis  2 days prior to their procedure which is scheduled for 11/21/24.  Please route your response to Alan Fusi, CMA.  Sincerely,    [facility]

## 2024-10-11 NOTE — Progress Notes (Signed)
 HPI:  Luke Sims is a 68 year old male who presents with a positive Cologuard test.  He had a positive Cologuard test on June 23, 2024. He previously underwent a colonoscopy many years ago in [place], [place] , due to unexpected bleeding, which revealed polyps. He has not experienced any recent bowel changes.  He experiences a sensation of 'real tightness in the stomach' most of the time, which is sometimes uncomfortable but not painful. This sensation occurs regardless of food intake and has been present since starting Mounjaro , a GLP-1 receptor agonist, in April. He is currently on a 12.5 mg dose, which he finds comfortable and effective. He has lost some weight and is participating in a weight wellness program.  He experiences occasional difficulty swallowing, particularly with pills, which sometimes 'hang up' but resolve with additional water. No sensation of food sticking in his chest.  He takes Eliquis  daily for atrial flutter and has a history of a stent placement in 2017 or 2018 due to an 80% blockage. He was on Plavix for a year post-stent placement. His last echocardiogram in March 2021 was performed at [facility]  He has a history of fatty liver, but his recent liver function tests, including AST, ALT, and albumin, are normal. His hemoglobin A1c is 7.2, indicating diabetes management.   He has a history of diastasis recti, which he describes as a protrusion when contracting his abdominal muscles. He is not concerned about its appearance and is working on weight loss.     Past Medical History:  Diagnosis Date   Atrial flutter (HCC)    Colon polyps    Depression    Diabetes mellitus without complication (HCC) 20 years   DISH (diffuse idiopathic skeletal hyperostosis)    Heart disease    Hypertension 20 years   Obesity    Sleep apnea 23 years    Past Surgical History:  Procedure Laterality Date   CARDIAC CATHETERIZATION  2018   one stint placed- done thru [facility]    COLONOSCOPY      Outpatient Medications Prior to Visit  Medication Sig Dispense Refill   amLODipine  (NORVASC ) 5 MG tablet Take 1 tablet (5 mg total) by mouth daily. 90 tablet 3   apixaban  (ELIQUIS ) 5 MG TABS tablet TAKE 1 TABLET BY MOUTH 2 TIMES A DAY 60 tablet 2   aspirin EC 81 MG tablet Take 81 mg by mouth daily.     empagliflozin  (JARDIANCE ) 25 MG TABS tablet Take 1 tablet (25 mg total) by mouth daily before breakfast. 100 tablet 3   metFORMIN  (GLUCOPHAGE -XR) 500 MG 24 hr tablet TAKE 2 TABLETS BY MOUTH DAILY IN THE MORNING AND TAKE 2 TABLETS AT BEDTIME AS DIRECTED 400 tablet 2   MOUNJARO  12.5 MG/0.5ML Pen Inject 12.5 mg into the skin once a week.     rosuvastatin  (CRESTOR ) 20 MG tablet TAKE 1 TABLET BY MOUTH DAILY 100 tablet 1   tamsulosin  (FLOMAX ) 0.4 MG CAPS capsule TAKE 1 CAPSULE BY MOUTH DAILY 100 capsule 3   valsartan -hydrochlorothiazide  (DIOVAN -HCT) 320-25 MG tablet TAKE 1 TABLET BY MOUTH DAILY 90 tablet 0   tirzepatide  (MOUNJARO ) 15 MG/0.5ML Pen Inject 15 mg into the skin once a week. 6 mL 1   No facility-administered medications prior to visit.    Allergies  Allergen Reactions   Ace Inhibitors Cough and Other (See Comments)    Cough     Family History  Problem Relation Age of Onset   Diabetes Mother    Stomach cancer  Mother    Heart disease Father    Hyperlipidemia Father    Diabetes Sister    Arthritis Sister    Arthritis Sister    COPD Brother    Hypertension Son    Colon cancer Neg Hx    Esophageal cancer Neg Hx     Social History   Tobacco Use   Smoking status: Some Days    Types: Cigars   Smokeless tobacco: Never  Vaping Use   Vaping status: Never Used  Substance Use Topics   Alcohol use: Yes    Comment: 3x week   Drug use: Never    ROS: As per history of present illness, otherwise negative  BP 118/64   Pulse (!) 58   Ht 5' 9 (1.753 m)   Wt 263 lb (119.3 kg)   BMI 38.84 kg/m  Gen: awake, alert, NAD HEENT: anicteric  CV: RRR, no  mrg Pulm: CTA b/l Abd: soft, NT/ND, +BS throughout Ext: no c/c/e Neuro: nonfocal   RELEVANT LABS AND IMAGING: CBC    Component Value Date/Time   WBC 5.3 10/19/2023 0815   RBC 5.42 10/19/2023 0815   HGB 16.4 10/19/2023 0815   HCT 48.9 10/19/2023 0815   PLT 192.0 10/19/2023 0815   MCV 90.1 10/19/2023 0815   MCHC 33.5 10/19/2023 0815   RDW 14.2 10/19/2023 0815    CMP     Component Value Date/Time   NA 140 08/25/2024 0733   K 3.8 08/25/2024 0733   CL 102 08/25/2024 0733   CO2 27 08/25/2024 0733   GLUCOSE 117 (H) 08/25/2024 0733   BUN 16 08/25/2024 0733   CREATININE 0.80 08/25/2024 0733   CALCIUM  9.6 08/25/2024 0733   PROT 7.2 08/25/2024 0733   ALBUMIN 4.7 08/25/2024 0733   AST 20 08/25/2024 0733   ALT 23 08/25/2024 0733   ALKPHOS 41 08/25/2024 0733   BILITOT 0.9 08/25/2024 0733    Results   LABS CMP: Creatinine 0.8, BUN 16, AST 20, ALT 23, Albumin 4.7, Total bilirubin 0.9, Alkaline phosphatase 41 (08/25/2024) Hemoglobin A1c: 7.2 CBC: Hemoglobin 16.4, MCV 90.1, Platelet count 192, White count 5.3 (10/19/2023)  DIAGNOSTIC Echocardiogram: Normal left ventricular systolic function, mild left ventricular hypertrophy (LVH), normal right ventricular systolic function, trivial mitral regurgitation (MR), trivial pulmonary regurgitation (PR), trivial tricuspid regurgitation (TR), no valvular stenosis (02/26/2023)  PATHOLOGY Cologuard: Positive (06/23/2024)      ASSESSMENT/PLAN:     Positive Cologuard test Positive Cologuard test making pretest probability of polyps high. Previous colonoscopy 17 years ago with polyps. - Schedule colonoscopy in [facility] - Discontinue Cologuard testing post-colonoscopy, follow-up based on findings.  Dysphagia and abdominal tightness likely due to GLP-1 agonist therapy Intermittent dysphagia and abdominal tightness likely due to Mounjaro . Symptoms include pill swallowing difficulty and stomach tightness, possibly from delayed gastric emptying  and increased ghrelin. - Perform EGD to evaluate esophagus and stomach. - Continue Mounjaro  at 12.5 mg.  Fatty liver disease Fatty liver disease with normal liver enzymes, platelets, and albumin. Mounjaro  beneficial for management. - Schedule FibroScan in December (once available here at [facility]) to assess liver stiffness and scarring.  Diastasis recti Diastasis recti present without functional issues or herniation risk. - Reassurance.  Abdominal binder would be an option but he is not really interested.  Weight loss may help some.  General Health Maintenance On Eliquis  for atrial fibrillation/flutter, history of stent placement, normal echocardiogram March 2024. - Coordinate with cardiology for ongoing cardiac care.  - Will hold Eliquis  2  days prior to endoscopic procedures - will instruct when and how to resume after procedure. Benefits and risks of procedure explained including risks of bleeding, perforation, infection, missed lesions, reactions to medications and possible need for hospitalization and surgery for complications. Additional rare but real risk of stroke or other vascular clotting events off Eliquis  also explained and need to seek urgent help if any signs of these problems occur. Will communicate by phone or EMR with patient's  prescribing provider to confirm that holding Eliquis  is reasonable in this case.             Rr:Tzwiopwh, Mabel Mt, Do [address] [place],  [place] [address]

## 2024-10-12 NOTE — Telephone Encounter (Signed)
 Patient informed he can hold his Eliquis  2 days prior to his procedures. Patient verbalized understanding.

## 2024-10-17 NOTE — Telephone Encounter (Unsigned)
 Copied from CRM [id number]. Topic: General - Other >> Oct 17, 2024  9:45 AM Thersia BROCKS wrote: Reason for CRM: [facility] - surgery need clearance returned   faxnumber [phone]

## 2024-10-23 ENCOUNTER — Telehealth: Payer: Self-pay

## 2024-10-23 NOTE — Telephone Encounter (Signed)
 Surgery Clearance appt schedule 10/24/24 3:45

## 2024-10-24 ENCOUNTER — Ambulatory Visit: Admitting: Family Medicine

## 2024-10-24 VITALS — BP 135/82 | HR 53 | Temp 97.9°F | Ht 68.0 in | Wt 257.0 lb

## 2024-10-24 DIAGNOSIS — R5383 Other fatigue: Secondary | ICD-10-CM | POA: Diagnosis not present

## 2024-10-24 DIAGNOSIS — E119 Type 2 diabetes mellitus without complications: Secondary | ICD-10-CM

## 2024-10-24 DIAGNOSIS — Z6839 Body mass index (BMI) 39.0-39.9, adult: Secondary | ICD-10-CM

## 2024-10-24 DIAGNOSIS — I480 Paroxysmal atrial fibrillation: Secondary | ICD-10-CM

## 2024-10-24 DIAGNOSIS — R0602 Shortness of breath: Secondary | ICD-10-CM | POA: Diagnosis not present

## 2024-10-24 DIAGNOSIS — E66812 Obesity, class 2: Secondary | ICD-10-CM

## 2024-10-24 DIAGNOSIS — Z7985 Long-term (current) use of injectable non-insulin antidiabetic drugs: Secondary | ICD-10-CM

## 2024-10-24 DIAGNOSIS — G4733 Obstructive sleep apnea (adult) (pediatric): Secondary | ICD-10-CM

## 2024-10-24 DIAGNOSIS — Z7984 Long term (current) use of oral hypoglycemic drugs: Secondary | ICD-10-CM | POA: Diagnosis not present

## 2024-10-24 DIAGNOSIS — E1159 Type 2 diabetes mellitus with other circulatory complications: Secondary | ICD-10-CM | POA: Diagnosis not present

## 2024-10-24 DIAGNOSIS — I2510 Atherosclerotic heart disease of native coronary artery without angina pectoris: Secondary | ICD-10-CM

## 2024-10-24 DIAGNOSIS — Z1331 Encounter for screening for depression: Secondary | ICD-10-CM

## 2024-10-24 NOTE — Progress Notes (Signed)
 At a Glance:  Vitals Temp: 97.9 F (36.6 C) BP: 135/82 Pulse Rate: (!) 53 SpO2: 95 %   Anthropometric Measurements Height: 5' 8 (1.727 m) Weight: 257 lb (116.6 kg) BMI (Calculated): 39.09 Starting Weight: 257lb Peak Weight: 337lb   Body Composition  Body Fat %: 34.7 % Fat Mass (lbs): 89.4 lbs Muscle Mass (lbs): 159.8 lbs Total Body Water (lbs): 117.8 lbs Visceral Fat Rating : 23   Other Clinical Data RMR: 2318 Fasting: yes Labs: yes Today's Visit #: 1 Starting Date: 10/24/24    EKG: Afib with PVCs, rate 73. RBBB with LABF block  Indirect Calorimeter completed today shows a VO2 of 335 and a REE of 2318.  His calculated basal metabolic rate is 7710 thus his basal metabolic rate is better than expected.  Chief Complaint:  Obesity   Subjective:  Luke Sims (MR# [id number]) is a 68 y.o. male who presents for evaluation and treatment of obesity and related comorbidities.   Luke Sims is currently in the action stage of change and ready to dedicate time achieving and maintaining a healthier weight. Luke Sims is interested in becoming our patient and working on intensive lifestyle modifications including (but not limited to) diet and exercise for weight loss.  Luke Sims has been struggling with his weight. He has been unsuccessful in either losing weight, maintaining weight loss, or reaching his healthy weight goal. He works a sedentary job as a loss adjuster, chartered and lives with his wife.  He has a + fam hx of obesity.  He would like to lose ~30 lb.  He does no regular exercise.  He is scheduled for L shoulder surgery in Nov and plans to have R shoulder surgery after.  DISH dz does not limit his walking ability.  Luke Sims habits were reviewed today and are as follows: His family eats meals together, he has been heavy most of his life, his heaviest weight ever was 333  pounds, he snacks frequently in the evenings, he skips meals frequently, he frequently makes poor food choices, and  he frequently eats larger portions than normal. He has skipped breakfast, did IF for a while.  He eats out 4 meals/ wk, has 0-2 ETOH drinks/ wk, admits to comfort and stress eating and oversnacking.     Other Fatigue Luke Sims admits to daytime somnolence and admits to waking up still tired. Patient has a history of symptoms of daytime fatigue. Luke Sims generally gets 4 or 5 hours of sleep per night, and states that he has nightime awakenings. Snoring is present. Apneic episodes are present. Epworth Sleepiness Score is 16. Uses CPAP nightly for OSA  Shortness of Breath Luke Sims notes increasing shortness of breath with exercising and seems to be worsening over time with weight gain. He notes getting out of breath sooner with activity than he used to. This has gotten worse recently. Luke Sims denies shortness of breath at rest or orthopnea.   Depression Screen Luke Sims's Food and Mood (modified PHQ-9) score was 9.     10/24/2024    7:55 AM  Depression screen PHQ 2/9  Decreased Interest 0  Down, Depressed, Hopeless 0  PHQ - 2 Score 0  Altered sleeping 2  Tired, decreased energy 2  Change in appetite 1  Feeling bad or failure about yourself  0  Trouble concentrating 0  Moving slowly or fidgety/restless 0  Suicidal thoughts 0  PHQ-9 Score 5  Difficult doing work/chores Not difficult at all     Assessment and Plan:   Other  Fatigue Axxel does feel that his weight is causing his energy to be lower than it should be. Fatigue may be related to obesity, depression or many other causes. Labs will be ordered, and in the meanwhile, Luke Sims will focus on self care including making healthy food choices, increasing physical activity and focusing on stress reduction.  Shortness of Breath Luke Sims does feel that he gets out of breath more easily that he used to when he exercises. Luke Sims shortness of breath appears to be obesity related and exercise induced. He has agreed to work on weight loss and gradually increase exercise  to treat his exercise induced shortness of breath. Will continue to monitor closely.    Problem List Items Addressed This Visit     Coronary artery disease involving native heart without angina pectoris He was referred to Dr Sheena last year by his PCP but never established care. Cardiac stenting done at [facility] in the past Recommend establishing care with cardiology  Continue to work on improving cardiometabolic health, BMI reduction    Paroxysmal atrial fibrillation (HCC) Asymptomatic with normal HR on EKG today Avoid use of stimulants Continue Eliquis  5 mg bid by PCP    Diabetes mellitus (HCC) Lab Results  Component Value Date   HGBA1C 7.2 (H) 08/25/2024   A1c not due Begin prescribed meal plan Discussed plan to ramp up walking time, check out local [employer] using [employer] Doing well on Jaridance 25 mg daily, Metformin  XR 1000 mg bid and Mounjaro  12.5 mg weekly Work on improving compliance with checking glucose readings (has glucometer).  He plans to talk to Dr Frann about getting a CGM.    Relevant Orders   Insulin, random   Other Visit Diagnoses       Other fatigue    -  Primary   Relevant Orders   EKG 12-Lead (Completed)   VITAMIN D 25 Hydroxy (Vit-D Deficiency, Fractures)   TSH   T4, free   T3   Folate   Vitamin B12   CBC with Differential/Platelet     SOBOE (shortness of breath on exertion)         Depression screen   Reviewed PHQ 9 score       Class 2 severe obesity due to excess calories with serious comorbidity and body mass index (BMI) of 39.0 to 39.9 in adult         OSA (obstructive sleep apnea)   Wearing CPAP nightly for OSA Begin active plan for weight reduction Work on improving sleep quality and sleep time Currently getting inadequate hours of sleep         Luke Sims is currently in the action stage of change and his goal is to get back to weightloss efforts . I recommend Luke Sims begin the structured treatment plan as follows:  He has agreed to  Category 4 Plan  Exercise goals: Older adults should follow the adult guidelines. When older adults cannot meet the adult guidelines, they should be as physically active as their abilities and conditions will allow.  Behavioral modification strategies:increasing lean protein intake, increasing vegetables, increase H2O intake, decrease liquid calories, decrease ETOH, decreasing eating out, no skipping meals, meal planning and cooking strategies, keeping healthy foods in the home, better snacking choices, avoiding temptations, planning for success, and decrease junk food  200 calorie snack list given  He was informed of the importance of frequent follow-up visits to maximize his success with intensive lifestyle modifications for his multiple health conditions. He was informed we  would discuss his lab results at his next visit unless there is a critical issue that needs to be addressed sooner. Luke Sims agreed to keep his next visit at the agreed upon time to discuss these results.  Objective:  General: Cooperative, alert, well developed, in no acute distress. HEENT: Conjunctivae and lids unremarkable. Cardiovascular: Regular rhythm.  Lungs: Normal work of breathing. Neurologic: No focal deficits.   Lab Results  Component Value Date   CREATININE 0.80 08/25/2024   BUN 16 08/25/2024   NA 140 08/25/2024   K 3.8 08/25/2024   CL 102 08/25/2024   CO2 27 08/25/2024   Lab Results  Component Value Date   ALT 23 08/25/2024   AST 20 08/25/2024   ALKPHOS 41 08/25/2024   BILITOT 0.9 08/25/2024   Lab Results  Component Value Date   HGBA1C 7.2 (H) 08/25/2024   HGBA1C 7.8 (H) 05/24/2024   HGBA1C 9.1 (H) 02/18/2024   HGBA1C 7.6 (H) 10/19/2023   HGBA1C 7.4 (H) 06/29/2023   No results found for: INSULIN Lab Results  Component Value Date   TSH 3.33 02/20/2022   Lab Results  Component Value Date   CHOL 96 08/25/2024   HDL 35.30 (L) 08/25/2024   LDLCALC 36 08/25/2024   TRIG 123.0 08/25/2024    CHOLHDL 3 08/25/2024   Lab Results  Component Value Date   WBC 5.3 10/19/2023   HGB 16.4 10/19/2023   HCT 48.9 10/19/2023   MCV 90.1 10/19/2023   PLT 192.0 10/19/2023   No results found for: IRON, TIBC, FERRITIN  Attestation Statements:  Reviewed by clinician on day of visit: allergies, medications, problem list, medical history, surgical history, family history, social history, and previous encounter notes.  Time spent on visit including pre-visit chart review and post-visit charting and face- to face care including nutritional counseling, review of EKG, interpretation of body composition scale and indirect calorimetry and nutrition prescription  was 45 minutes.   Darice Haddock, D.O. DABFM, DABOM [facility] [address] [place], [place] [address] [phone]

## 2024-10-25 LAB — CBC WITH DIFFERENTIAL/PLATELET
Basophils Absolute: 0.1 x10E3/uL (ref 0.0–0.2)
Basos: 1 %
EOS (ABSOLUTE): 0.1 x10E3/uL (ref 0.0–0.4)
Eos: 2 %
Hematocrit: 48.4 % (ref 37.5–51.0)
Hemoglobin: 15.9 g/dL (ref 13.0–17.7)
Immature Grans (Abs): 0 x10E3/uL (ref 0.0–0.1)
Immature Granulocytes: 0 %
Lymphocytes Absolute: 1.1 x10E3/uL (ref 0.7–3.1)
Lymphs: 21 %
MCH: 29 pg (ref 26.6–33.0)
MCHC: 32.9 g/dL (ref 31.5–35.7)
MCV: 88 fL (ref 79–97)
Monocytes Absolute: 0.4 x10E3/uL (ref 0.1–0.9)
Monocytes: 8 %
Neutrophils Absolute: 3.5 x10E3/uL (ref 1.4–7.0)
Neutrophils: 68 %
Platelets: 189 x10E3/uL (ref 150–450)
RBC: 5.48 x10E6/uL (ref 4.14–5.80)
RDW: 13.8 % (ref 11.6–15.4)
WBC: 5.3 x10E3/uL (ref 3.4–10.8)

## 2024-10-25 LAB — T4, FREE: Free T4: 1.2 ng/dL (ref 0.82–1.77)

## 2024-10-25 LAB — T3: T3, Total: 117 ng/dL (ref 71–180)

## 2024-10-25 LAB — VITAMIN D 25 HYDROXY (VIT D DEFICIENCY, FRACTURES): Vit D, 25-Hydroxy: 41.7 ng/mL (ref 30.0–100.0)

## 2024-10-25 LAB — FOLATE: Folate: 11.5 ng/mL (ref >3.0)

## 2024-10-25 LAB — INSULIN, RANDOM: INSULIN: 10.8 u[IU]/mL (ref 2.6–24.9)

## 2024-10-25 LAB — TSH: TSH: 2.83 u[IU]/mL (ref 0.450–4.500)

## 2024-10-25 LAB — VITAMIN B12: Vitamin B-12: 277 pg/mL (ref 232–1245)

## 2024-10-26 ENCOUNTER — Ambulatory Visit: Payer: Self-pay | Admitting: Family Medicine

## 2024-10-27 ENCOUNTER — Encounter: Payer: Self-pay | Admitting: Family Medicine

## 2024-10-27 ENCOUNTER — Ambulatory Visit (INDEPENDENT_AMBULATORY_CARE_PROVIDER_SITE_OTHER): Admitting: Family Medicine

## 2024-10-27 VITALS — BP 128/60 | HR 64 | Resp 18 | Ht 68.0 in | Wt 263.0 lb

## 2024-10-27 DIAGNOSIS — Z01818 Encounter for other preprocedural examination: Secondary | ICD-10-CM

## 2024-10-27 DIAGNOSIS — Z23 Encounter for immunization: Secondary | ICD-10-CM | POA: Diagnosis not present

## 2024-10-27 NOTE — Patient Instructions (Addendum)
 Hold the Eliquis  on 11/16 and 11/17 for your 11/18 surgery.   Let us  know if you need anything.
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
## 2024-11-03 ENCOUNTER — Other Ambulatory Visit: Payer: Self-pay | Admitting: Family Medicine

## 2024-11-08 ENCOUNTER — Encounter: Payer: Self-pay | Admitting: Family Medicine

## 2024-11-08 ENCOUNTER — Ambulatory Visit (INDEPENDENT_AMBULATORY_CARE_PROVIDER_SITE_OTHER): Admitting: Family Medicine

## 2024-11-08 VITALS — BP 126/77 | HR 55 | Temp 98.1°F | Ht 68.0 in | Wt 252.0 lb

## 2024-11-08 DIAGNOSIS — K760 Fatty (change of) liver, not elsewhere classified: Secondary | ICD-10-CM | POA: Diagnosis not present

## 2024-11-08 DIAGNOSIS — Z7984 Long term (current) use of oral hypoglycemic drugs: Secondary | ICD-10-CM

## 2024-11-08 DIAGNOSIS — I2510 Atherosclerotic heart disease of native coronary artery without angina pectoris: Secondary | ICD-10-CM

## 2024-11-08 DIAGNOSIS — I480 Paroxysmal atrial fibrillation: Secondary | ICD-10-CM

## 2024-11-08 DIAGNOSIS — E1159 Type 2 diabetes mellitus with other circulatory complications: Secondary | ICD-10-CM

## 2024-11-08 DIAGNOSIS — E66812 Obesity, class 2: Secondary | ICD-10-CM | POA: Diagnosis not present

## 2024-11-08 DIAGNOSIS — Z7985 Long-term (current) use of injectable non-insulin antidiabetic drugs: Secondary | ICD-10-CM | POA: Diagnosis not present

## 2024-11-08 DIAGNOSIS — E538 Deficiency of other specified B group vitamins: Secondary | ICD-10-CM

## 2024-11-08 DIAGNOSIS — Z6838 Body mass index (BMI) 38.0-38.9, adult: Secondary | ICD-10-CM | POA: Diagnosis not present

## 2024-11-08 NOTE — Patient Instructions (Signed)
 Fruit- 1-2 servings per day (any fresh or frozen)  Dinner : option to have one serving of starch (1/4 plate size) Rice, potato, pasta (Barilla protein pasta), carb balance tortillas, butternut squash, beans  Stay as as active as you can Hydrate well with water Great job reducing sweets!

## 2024-11-08 NOTE — Progress Notes (Signed)
 Office: [phone]  /  Fax: [phone]  WEIGHT SUMMARY AND BIOMETRICS  Starting Date: 10/24/24  Starting Weight: 257lb   Weight Lost Since Last Visit: 5lb   Vitals Temp: 98.1 F (36.7 C) BP: 126/77 Pulse Rate: (!) 55 SpO2: 97 %   Body Composition  Body Fat %: 33.9 % Fat Mass (lbs): 85.4 lbs Muscle Mass (lbs): 158.4 lbs Total Body Water (lbs): 121.4 lbs Visceral Fat Rating : 22    HPI  Chief Complaint: OBESITY  Luke Sims is here to discuss his progress with his obesity treatment plan. He is on the the Category 4 Plan and states he is following his eating plan approximately 70 % of the time. He states he is has done some walking.   Interval History:  Since last office visit he is down 5 lb He is down 1.4 lb of muscle mass and is down 4 lb of body fat He is going thru a kitchen remodel, making better choices when eating out He does feel adequately full at the end of the meals He denies excess hunger or cravings His wife is supportive He is scheduled for L shoulder surgery next week He is staying active doing home projects  Pharmacotherapy: none  PHYSICAL EXAM:  Blood pressure 126/77, pulse (!) 55, temperature 98.1 F (36.7 C), height 5' 8 (1.727 m), weight 252 lb (114.3 kg), SpO2 97%. Body mass index is 38.32 kg/m.  General: He is overweight, cooperative, alert, well developed, and in no acute distress. PSYCH: Has normal mood, affect and thought process.   Lungs: Normal breathing effort, no conversational dyspnea.  ASSESSMENT AND PLAN  TREATMENT PLAN FOR OBESITY:  Recommended Dietary Goals  Luke Sims is currently in the action stage of change. As such, his goal is to continue weight management plan. He has agreed to the Category 4 Plan.  Behavioral Intervention  We discussed the following Behavioral Modification Strategies today: increasing lean protein intake to established goals, decreasing simple carbohydrates , increasing fiber rich foods, increasing  water intake , work on meal planning and preparation, keeping healthy foods at home, avoiding temptations and identifying enticing environmental cues, and better snacking choices.  Additional resources provided today: NA  Recommended Physical Activity Goals  Luke Sims has been advised to work up to 150 minutes of moderate intensity aerobic activity a week and strengthening exercises 2-3 times per week for cardiovascular health, weight loss maintenance and preservation of muscle mass.   He has agreed to Think about enjoyable ways to increase daily physical activity and overcoming barriers to exercise and Increase physical activity in their day and reduce sedentary time (increase NEAT).  Pharmacotherapy changes for the treatment of obesity: none  ASSOCIATED CONDITIONS ADDRESSED TODAY  Coronary artery disease involving native heart without angina pectoris, unspecified vessel or lesion type He has not established care with cardiology since moving here He has a known hx of CAD with stent placement 2018, pAF, RBBB.  He denies CP or DOE. He was previously seen by [facility]. Referral place to est care: -     Ambulatory referral to Cardiology  Paroxysmal atrial fibrillation (HCC) On Eliquis  5 mg bid with PCP HR is WNL today -     Ambulatory referral to Cardiology  Obesity, Class II, BMI 35-39.9 Improving  Reviewed bioimpedence results Look for further improvements following shoulder surgery when he can be a bit more active  BMI 38.0-38.9,adult  Low serum vitamin B12 Lab Results  Component Value Date   VITAMINB12 277 10/24/2024  B12 level is low normal.  He denies brain fog or paresthesias or extreme fatigue.  He does eat eggs and red meat.  He is currently on metformin  which may be lowering his B12 level.  He can either take a men's multivitamin or B12 500 mcg daily for supplementation.  Type 2 diabetes mellitus with other circulatory complication, without long-term current use of  insulin (HCC) Lab Results  Component Value Date   HGBA1C 7.2 (H) 08/25/2024  He is doing well on Jardiance  25 mg daily, metformin  XR 1000 mg twice daily, Mounjaro  12.5 mg once weekly injection.  He is doing well on prescribed diet, initiating weight loss.  Plan to ramp up exercise following shoulder surgery.  He has been holding his Mounjaro  for his upcoming surgery scheduled for 11/18.  Fatty liver Fibrosis 4 Score = 1.46  Fib-4 interpretation is not validated for people under 35 or over 75 years of age. However, scores under 2.0 are generally considered low risk.  Continue active plan for weight reduction, working on insulin resistance and body fat loss.  Aim for a 10% total body weight loss for the treatment of fatty liver.  His fib 4 score is too low for elastography.   He was informed of the importance of frequent follow up visits to maximize his success with intensive lifestyle modifications for his multiple health conditions.   ATTESTASTION STATEMENTS:  Reviewed by clinician on day of visit: allergies, medications, problem list, medical history, surgical history, family history, social history, and previous encounter notes pertinent to obesity diagnosis.   I have personally spent 35 minutes total time today in preparation, patient care, nutritional counseling and education,  and documentation for this visit, including the following: review of most recent clinical lab tests, placing referrals and reading cardiology notes, reviewing medical assistant documentation, review and interpretation of bioimpedence results.     Darice Haddock, D.O. DABFM, DABOM [facility] [address] [place], [place] [address] [phone]

## 2024-11-14 DIAGNOSIS — S46212A Strain of muscle, fascia and tendon of other parts of biceps, left arm, initial encounter: Secondary | ICD-10-CM | POA: Diagnosis not present

## 2024-11-14 DIAGNOSIS — M7582 Other shoulder lesions, left shoulder: Secondary | ICD-10-CM | POA: Diagnosis not present

## 2024-11-14 DIAGNOSIS — Z79899 Other long term (current) drug therapy: Secondary | ICD-10-CM | POA: Diagnosis not present

## 2024-11-14 DIAGNOSIS — G8918 Other acute postprocedural pain: Secondary | ICD-10-CM | POA: Diagnosis not present

## 2024-11-14 DIAGNOSIS — Z7982 Long term (current) use of aspirin: Secondary | ICD-10-CM | POA: Diagnosis not present

## 2024-11-14 DIAGNOSIS — Z888 Allergy status to other drugs, medicaments and biological substances status: Secondary | ICD-10-CM | POA: Diagnosis not present

## 2024-11-14 DIAGNOSIS — E119 Type 2 diabetes mellitus without complications: Secondary | ICD-10-CM | POA: Diagnosis not present

## 2024-11-14 DIAGNOSIS — Z7985 Long-term (current) use of injectable non-insulin antidiabetic drugs: Secondary | ICD-10-CM | POA: Diagnosis not present

## 2024-11-14 DIAGNOSIS — M7552 Bursitis of left shoulder: Secondary | ICD-10-CM | POA: Diagnosis not present

## 2024-11-14 DIAGNOSIS — I119 Hypertensive heart disease without heart failure: Secondary | ICD-10-CM | POA: Diagnosis not present

## 2024-11-14 DIAGNOSIS — E669 Obesity, unspecified: Secondary | ICD-10-CM | POA: Diagnosis not present

## 2024-11-14 DIAGNOSIS — G4733 Obstructive sleep apnea (adult) (pediatric): Secondary | ICD-10-CM | POA: Diagnosis not present

## 2024-11-14 DIAGNOSIS — Z6837 Body mass index (BMI) 37.0-37.9, adult: Secondary | ICD-10-CM | POA: Diagnosis not present

## 2024-11-14 DIAGNOSIS — M75112 Incomplete rotator cuff tear or rupture of left shoulder, not specified as traumatic: Secondary | ICD-10-CM | POA: Diagnosis not present

## 2024-11-14 DIAGNOSIS — M19012 Primary osteoarthritis, left shoulder: Secondary | ICD-10-CM | POA: Diagnosis not present

## 2024-11-14 DIAGNOSIS — Z7984 Long term (current) use of oral hypoglycemic drugs: Secondary | ICD-10-CM | POA: Diagnosis not present

## 2024-11-14 DIAGNOSIS — M65812 Other synovitis and tenosynovitis, left shoulder: Secondary | ICD-10-CM | POA: Diagnosis not present

## 2024-11-14 DIAGNOSIS — Z955 Presence of coronary angioplasty implant and graft: Secondary | ICD-10-CM | POA: Diagnosis not present

## 2024-11-14 DIAGNOSIS — Z7901 Long term (current) use of anticoagulants: Secondary | ICD-10-CM | POA: Diagnosis not present

## 2024-11-14 DIAGNOSIS — M25812 Other specified joint disorders, left shoulder: Secondary | ICD-10-CM | POA: Diagnosis not present

## 2024-11-14 HISTORY — PX: SHOULDER SURGERY: SHX246

## 2024-11-16 ENCOUNTER — Other Ambulatory Visit: Payer: Self-pay | Admitting: Family Medicine

## 2024-11-21 ENCOUNTER — Encounter: Admitting: Internal Medicine

## 2024-12-01 DIAGNOSIS — H5213 Myopia, bilateral: Secondary | ICD-10-CM | POA: Diagnosis not present

## 2024-12-01 DIAGNOSIS — H2513 Age-related nuclear cataract, bilateral: Secondary | ICD-10-CM | POA: Diagnosis not present

## 2024-12-01 DIAGNOSIS — H43813 Vitreous degeneration, bilateral: Secondary | ICD-10-CM | POA: Diagnosis not present

## 2024-12-01 DIAGNOSIS — E119 Type 2 diabetes mellitus without complications: Secondary | ICD-10-CM | POA: Diagnosis not present

## 2024-12-01 DIAGNOSIS — H524 Presbyopia: Secondary | ICD-10-CM | POA: Diagnosis not present

## 2024-12-01 DIAGNOSIS — H268 Other specified cataract: Secondary | ICD-10-CM | POA: Diagnosis not present

## 2024-12-01 DIAGNOSIS — H11153 Pinguecula, bilateral: Secondary | ICD-10-CM | POA: Diagnosis not present

## 2024-12-11 ENCOUNTER — Ambulatory Visit: Admitting: Family Medicine

## 2024-12-18 ENCOUNTER — Encounter: Payer: Self-pay | Admitting: Internal Medicine

## 2024-12-18 ENCOUNTER — Ambulatory Visit: Admitting: Internal Medicine

## 2024-12-18 VITALS — BP 115/74 | HR 92 | Temp 97.9°F | Resp 16 | Ht 69.0 in | Wt 263.0 lb

## 2024-12-18 DIAGNOSIS — R1310 Dysphagia, unspecified: Secondary | ICD-10-CM | POA: Diagnosis not present

## 2024-12-18 DIAGNOSIS — K3189 Other diseases of stomach and duodenum: Secondary | ICD-10-CM | POA: Diagnosis not present

## 2024-12-18 DIAGNOSIS — K648 Other hemorrhoids: Secondary | ICD-10-CM | POA: Diagnosis not present

## 2024-12-18 DIAGNOSIS — K2970 Gastritis, unspecified, without bleeding: Secondary | ICD-10-CM | POA: Diagnosis not present

## 2024-12-18 DIAGNOSIS — K635 Polyp of colon: Secondary | ICD-10-CM

## 2024-12-18 DIAGNOSIS — K5730 Diverticulosis of large intestine without perforation or abscess without bleeding: Secondary | ICD-10-CM | POA: Diagnosis not present

## 2024-12-18 DIAGNOSIS — R195 Other fecal abnormalities: Secondary | ICD-10-CM | POA: Diagnosis not present

## 2024-12-18 DIAGNOSIS — D124 Benign neoplasm of descending colon: Secondary | ICD-10-CM

## 2024-12-18 DIAGNOSIS — Z1211 Encounter for screening for malignant neoplasm of colon: Secondary | ICD-10-CM

## 2024-12-18 DIAGNOSIS — D125 Benign neoplasm of sigmoid colon: Secondary | ICD-10-CM

## 2024-12-18 DIAGNOSIS — D123 Benign neoplasm of transverse colon: Secondary | ICD-10-CM

## 2024-12-18 DIAGNOSIS — R198 Other specified symptoms and signs involving the digestive system and abdomen: Secondary | ICD-10-CM

## 2024-12-18 MED ORDER — DEXTROSE 5 % IV SOLN: INTRAVENOUS | Status: AC

## 2024-12-18 MED ORDER — SODIUM CHLORIDE 0.9 % IV SOLN: 500.0000 mL | INTRAVENOUS | Status: DC

## 2024-12-18 NOTE — Progress Notes (Signed)
 Vss nad trans to pacu

## 2024-12-18 NOTE — Progress Notes (Signed)
 Pt's states no medical or surgical changes since previsit or office visit.

## 2024-12-18 NOTE — Patient Instructions (Addendum)
 Continue present medications. Await pathology results. Repeat colonoscopy is recommended for surveillance. The colonoscopy date will be determined after pathology results from today's exam become available for review. Resume Eliquis  (apixaban ) at prior dose tomorrow. Refer to managing physician for further adjustment of therapy.   YOU HAD AN ENDOSCOPIC PROCEDURE TODAY AT THE [facility]:   Refer to the procedure report that was given to you for any specific questions about what was found during the examination.  If the procedure report does not answer your questions, please call your gastroenterologist to clarify.  If you requested that your care partner not be given the details of your procedure findings, then the procedure report has been included in a sealed envelope for you to review at your convenience later.  YOU SHOULD EXPECT: Some feelings of bloating in the abdomen. Passage of more gas than usual.  Walking can help get rid of the air that was put into your GI tract during the procedure and reduce the bloating. If you had a lower endoscopy (such as a colonoscopy or flexible sigmoidoscopy) you may notice spotting of blood in your stool or on the toilet paper. If you underwent a bowel prep for your procedure, you may not have a normal bowel movement for a few days.  Please Note:  You might notice some irritation and congestion in your nose or some drainage.  This is from the oxygen used during your procedure.  There is no need for concern and it should clear up in a day or so.  SYMPTOMS TO REPORT IMMEDIATELY:  Following lower endoscopy (colonoscopy or flexible sigmoidoscopy):  Excessive amounts of blood in the stool  Significant tenderness or worsening of abdominal pains  Swelling of the abdomen that is new, acute  Fever of 100F or higher  Following upper endoscopy (EGD)  Vomiting of blood or coffee ground material  New chest pain or pain under the shoulder blades  Painful  or persistently difficult swallowing  New shortness of breath  Fever of 100F or higher  Black, tarry-looking stools  For urgent or emergent issues, a gastroenterologist can be reached at any hour by calling [phone]. Do not use MyChart messaging for urgent concerns.    DIET:  We do recommend a small meal at first, but then you may proceed to your regular diet.  Drink plenty of fluids but you should avoid alcoholic beverages for 24 hours.  ACTIVITY:  You should plan to take it easy for the rest of today and you should NOT DRIVE or use heavy machinery until tomorrow (because of the sedation medicines used during the test).    FOLLOW UP: Our staff will call the number listed on your records the next business day following your procedure.  We will call around 7:15- 8:00 am to check on you and address any questions or concerns that you may have regarding the information given to you following your procedure. If we do not reach you, we will leave a message.     If any biopsies were taken you will be contacted by phone or by letter within the next 1-3 weeks.  Please call us  at [phone] if you have not heard about the biopsies in 3 weeks.    SIGNATURES/CONFIDENTIALITY: You and/or your care partner have signed paperwork which will be entered into your electronic medical record.  These signatures attest to the fact that that the information above on your After Visit Summary has been reviewed and is understood.  Full responsibility of the confidentiality of this discharge information lies with you and/or your care-partner.

## 2024-12-18 NOTE — Progress Notes (Signed)
 Called to room to assist during endoscopic procedure.  Patient ID and intended procedure confirmed with present staff. Received instructions for my participation in the procedure from the performing physician.

## 2024-12-18 NOTE — Op Note (Addendum)
 [facility] Patient Name: Luke Sims Procedure Date: 12/18/2024 1:09 PM MRN: [id number] Endoscopist: Gordy CHRISTELLA Starch , MD, [id number] Age: 68 Referring MD:  Date of Birth: [birth year] Gender: Male Account #: [id number] Procedure:                Colonoscopy Indications:              Positive Cologuard test Medicines:                Monitored Anesthesia Care Procedure:                Pre-Anesthesia Assessment:                           - Prior to the procedure, a History and Physical                            was performed, and patient medications and                            allergies were reviewed. The patient's tolerance of                            previous anesthesia was also reviewed. The risks                            and benefits of the procedure and the sedation                            options and risks were discussed with the patient.                            All questions were answered, and informed consent                            was obtained. Prior Anticoagulants: The patient has                            taken Eliquis  (apixaban ), last dose was 3 days                            prior to procedure. ASA Grade Assessment: III - A                            patient with severe systemic disease. After                            reviewing the risks and benefits, the patient was                            deemed in satisfactory condition to undergo the                            procedure.  After obtaining informed consent, the colonoscope                            was passed under direct vision. Throughout the                            procedure, the patient's blood pressure, pulse, and                            oxygen saturations were monitored continuously. The                            CF [id number] #[id number] was introduced through the anus                            and advanced to the cecum, identified by                             appendiceal orifice and ileocecal valve. The                            colonoscopy was performed without difficulty. The                            patient tolerated the procedure well. The quality                            of the bowel preparation was good. The ileocecal                            valve, appendiceal orifice, and rectum were                            photographed. Scope In: 1:51:22 PM Scope Out: 2:17:55 PM Scope Withdrawal Time: 0 hours 17 minutes 58 seconds  Total Procedure Duration: 0 hours 26 minutes 33 seconds  Findings:                 The digital rectal exam was normal.                           A 5 mm polyp was found in the hepatic flexure. The                            polyp was sessile. The polyp was removed with a                            cold snare. Resection and retrieval were complete.                           Two sessile polyps were found in the transverse                            colon. The polyps were 4 to 6 mm  in size. These                            polyps were removed with a cold snare. Resection                            and retrieval were complete.                           A 5 mm polyp was found in the descending colon. The                            polyp was sessile. The polyp was removed with a                            cold snare. Resection and retrieval were complete.                           A 7 mm polyp was found in the sigmoid colon. The                            polyp was sessile. The polyp was removed with a                            cold snare. Resection and retrieval were complete.                           Multiple large-mouthed, medium-mouthed and                            small-mouthed diverticula were found in the                            descending colon, transverse colon and ascending                            colon.                           Internal hemorrhoids were found during                             retroflexion. The hemorrhoids were small. Complications:            No immediate complications. Estimated Blood Loss:     Estimated blood loss was minimal. Impression:               - One 5 mm polyp at the hepatic flexure, removed                            with a cold snare. Resected and retrieved.                           - Two 4 to 6 mm polyps in the transverse colon,  removed with a cold snare. Resected and retrieved.                           - One 5 mm polyp in the descending colon, removed                            with a cold snare. Resected and retrieved.                           - One 7 mm polyp in the sigmoid colon, removed with                            a cold snare. Resected and retrieved.                           - Moderate diverticulosis in the descending colon,                            in the transverse colon and in the ascending colon.                           - Internal hemorrhoids. Recommendation:           - Patient has a contact number available for                            emergencies. The signs and symptoms of potential                            delayed complications were discussed with the                            patient. Return to normal activities tomorrow.                            Written discharge instructions were provided to the                            patient.                           - Resume previous diet.                           - Continue present medications.                           - Resume Eliquis  (apixaban ) at prior dose tomorrow.                            Refer to managing physician for further adjustment                            of therapy.                           -  Await pathology results.                           - Repeat colonoscopy is recommended for                            surveillance. The colonoscopy date will be                            determined after pathology results from  today's                            exam become available for review. Gordy CHRISTELLA Starch, MD 12/18/2024 2:26:05 PM This report has been signed electronically.

## 2024-12-18 NOTE — Progress Notes (Signed)
 "   GASTROENTEROLOGY PROCEDURE H&P NOTE   Primary Care Physician: Frann Mabel Mt, DO    Reason for Procedure:  Dysphagia, abdominal tightness and positive Cologuard  Plan:    EGD with possible dilation and colonoscopy  Patient is appropriate for endoscopic procedure(s) in the ambulatory ([facility]) setting.  The nature of the procedure, as well as the risks, benefits, and alternatives were carefully and thoroughly reviewed with the patient. Ample time for discussion and questions allowed.  All questions were answered. The patient understood, was satisfied, and agreed with the plan to proceed.    HPI: Luke Sims is a 68 y.o. male who presents for EGD and colonoscopy.  Medical history as below.  Tolerated the prep.  No recent chest pain or shortness of breath.  No abdominal pain today.  Past Medical History:  Diagnosis Date   Atrial flutter (HCC)    Back pain    Colon polyps    Depression    Diabetes (HCC)    Diabetes mellitus without complication (HCC) 20 years   DISH (diffuse idiopathic skeletal hyperostosis)    Heart disease    Hypertension 20 years   Obesity    Sleep apnea 23 years    Past Surgical History:  Procedure Laterality Date   CARDIAC CATHETERIZATION  2018   one stint placed- done thru [facility]   COLONOSCOPY     SHOULDER SURGERY Left 11/14/2024    Prior to Admission medications  Medication Sig Start Date End Date Taking? Authorizing Provider  amLODipine  (NORVASC ) 5 MG tablet Take 1 tablet (5 mg total) by mouth daily. 08/18/24  Yes Frann Mabel Mt, DO  aspirin EC 81 MG tablet Take 81 mg by mouth daily.   Yes [provider]  empagliflozin  (JARDIANCE ) 25 MG TABS tablet Take 1 tablet (25 mg total) by mouth daily before breakfast. 02/18/24  Yes Wendling, Mabel Mt, DO  metFORMIN  (GLUCOPHAGE -XR) 500 MG 24 hr tablet TAKE 2 TABLETS BY MOUTH DAILY IN THE MORNING AND TAKE 2 TABLETS AT BEDTIME AS DIRECTED 10/09/24  Yes Frann Mabel Mt, DO   rosuvastatin  (CRESTOR ) 20 MG tablet TAKE 1 TABLET BY MOUTH DAILY 08/30/24  Yes Frann Mabel Mt, DO  tamsulosin  (FLOMAX ) 0.4 MG CAPS capsule TAKE 1 CAPSULE BY MOUTH DAILY 08/29/24  Yes Frann Mabel Mt, DO  valsartan -hydrochlorothiazide  (DIOVAN -HCT) 320-25 MG tablet Take 1 tablet by mouth daily. 11/03/24  Yes Wendling, Mabel Mt, DO  apixaban  (ELIQUIS ) 5 MG TABS tablet TAKE 1 TABLET BY MOUTH 2 TIMES A DAY 11/16/24   Frann Mabel Mt, DO  tirzepatide  (MOUNJARO ) 12.5 MG/0.5ML Pen INJECT 12.5 MG UNDER THE SKIN ONCE WEEKLY 11/03/24   Frann Mabel Mt, DO    Current Outpatient Medications  Medication Sig Dispense Refill   amLODipine  (NORVASC ) 5 MG tablet Take 1 tablet (5 mg total) by mouth daily. 90 tablet 3   aspirin EC 81 MG tablet Take 81 mg by mouth daily.     empagliflozin  (JARDIANCE ) 25 MG TABS tablet Take 1 tablet (25 mg total) by mouth daily before breakfast. 100 tablet 3   metFORMIN  (GLUCOPHAGE -XR) 500 MG 24 hr tablet TAKE 2 TABLETS BY MOUTH DAILY IN THE MORNING AND TAKE 2 TABLETS AT BEDTIME AS DIRECTED 400 tablet 2   rosuvastatin  (CRESTOR ) 20 MG tablet TAKE 1 TABLET BY MOUTH DAILY 100 tablet 1   tamsulosin  (FLOMAX ) 0.4 MG CAPS capsule TAKE 1 CAPSULE BY MOUTH DAILY 100 capsule 3   valsartan -hydrochlorothiazide  (DIOVAN -HCT) 320-25 MG tablet Take 1 tablet by  mouth daily. 90 tablet 1   apixaban  (ELIQUIS ) 5 MG TABS tablet TAKE 1 TABLET BY MOUTH 2 TIMES A DAY 60 tablet 11   tirzepatide  (MOUNJARO ) 12.5 MG/0.5ML Pen INJECT 12.5 MG UNDER THE SKIN ONCE WEEKLY 2 mL 5   Current Facility-Administered Medications  Medication Dose Route Frequency Provider Last Rate Last Admin   0.9 %  sodium chloride  infusion  500 mL Intravenous Continuous Saraiah Bhat, Gordy HERO, MD       dextrose  5 % solution   Intravenous Continuous Raelynne Ludwick, Gordy HERO, MD        Allergies as of 12/18/2024 - Review Complete 12/18/2024  Allergen Reaction Noted   Ace inhibitors Cough and Other (See Comments) 01/02/2016     Family History  Problem Relation Age of Onset   Diabetes Mother    Stomach cancer Mother    Sleep apnea Mother    Obesity Mother    Obesity Father    Sleep apnea Father    Hypertension Father    Heart disease Father    Hyperlipidemia Father    Diabetes Sister    Arthritis Sister    Arthritis Sister    COPD Brother    Hypertension Son    Colon cancer Neg Hx    Esophageal cancer Neg Hx     Social History   Socioeconomic History   Marital status: Married    Spouse name: Not on file   Number of children: 3   Years of education: Not on file   Highest education level: Associate degree: occupational, scientist, product/process development, or vocational program  Occupational History   Not on file  Tobacco Use   Smoking status: Some Days    Types: Cigars   Smokeless tobacco: Never  Vaping Use   Vaping status: Never Used  Substance and Sexual Activity   Alcohol use: Yes    Comment: 3x week   Drug use: Never   Sexual activity: Not on file  Other Topics Concern   Not on file  Social History Narrative   Not on file   Social Drivers of Health   Tobacco Use: High Risk (12/18/2024)   Patient History    Smoking Tobacco Use: Some Days    Smokeless Tobacco Use: Never    Passive Exposure: Not on file  Financial Resource Strain: Low Risk (10/24/2024)   Overall Financial Resource Strain (CARDIA)    Difficulty of Paying Living Expenses: Not hard at all  Food Insecurity: No Food Insecurity (10/24/2024)   Epic    Worried About Radiation Protection Practitioner of Food in the Last Year: Never true    Ran Out of Food in the Last Year: Never true  Transportation Needs: No Transportation Needs (10/24/2024)   Epic    Lack of Transportation (Medical): No    Lack of Transportation (Non-Medical): No  Physical Activity: Insufficiently Active (10/24/2024)   Exercise Vital Sign    Days of Exercise per Week: 2 days    Minutes of Exercise per Session: 20 min  Stress: No Stress Concern Present (10/24/2024)   [employer] of  Occupational Health - Occupational Stress Questionnaire    Feeling of Stress: Not at all  Social Connections: Moderately Integrated (10/24/2024)   Social Connection and Isolation Panel    Frequency of Communication with Friends and Family: More than three times a week    Frequency of Social Gatherings with Friends and Family: Once a week    Attends Religious Services: 1 to 4 times per year    Active  Member of Clubs or Organizations: No    Attends Banker Meetings: Not on file    Marital Status: Married  Intimate Partner Violence: Not At Risk (06/13/2024)   Epic    Fear of Current or Ex-Partner: No    Emotionally Abused: No    Physically Abused: No    Sexually Abused: No  Depression (PHQ2-9): Medium Risk (10/24/2024)   Depression (PHQ2-9)    PHQ-2 Score: 5  Alcohol Screen: Low Risk (10/24/2024)   Alcohol Screen    Last Alcohol Screening Score (AUDIT): 2  Housing: Low Risk (10/24/2024)   Epic    Unable to Pay for Housing in the Last Year: No    Number of Times Moved in the Last Year: 0    Homeless in the Last Year: No  Utilities: Not At Risk (06/13/2024)   Epic    Threatened with loss of utilities: No  Health Literacy: Adequate Health Literacy (06/13/2024)   B1300 Health Literacy    Frequency of need for help with medical instructions: Never    Physical Exam: Vital signs in last 24 hours: @BP  136/83   Pulse 82   Temp 97.9 F (36.6 C) (Temporal)   Ht 5' 9 (1.753 m)   Wt 263 lb (119.3 kg)   SpO2 97%   BMI 38.84 kg/m  GEN: NAD EYE: Sclerae anicteric ENT: MMM CV: Non-tachycardic Pulm: CTA b/l GI: Soft, NT/ND NEURO:  Alert & Oriented x 3   Gordy Starch, MD [facility]  12/18/2024 1:40 PM  "

## 2024-12-18 NOTE — Op Note (Addendum)
 [facility] Patient Name: Luke Sims Procedure Date: 12/18/2024 1:09 PM MRN: [id number] Endoscopist: Gordy CHRISTELLA Starch , MD, [id number] Age: 68 Referring MD:  Date of Birth: July 17, 1956 Gender: Male Account #: [id number] Procedure:                Upper GI endoscopy Indications:              Upper abdominal pressure, Dysphagia Medicines:                Monitored Anesthesia Care Procedure:                Pre-Anesthesia Assessment:                           - Prior to the procedure, a History and Physical                            was performed, and patient medications and                            allergies were reviewed. The patient's tolerance of                            previous anesthesia was also reviewed. The risks                            and benefits of the procedure and the sedation                            options and risks were discussed with the patient.                            All questions were answered, and informed consent                            was obtained. Prior Anticoagulants: The patient has                            taken Eliquis  (apixaban ), last dose was 2 days                            prior to procedure. ASA Grade Assessment: III - A                            patient with severe systemic disease. After                            reviewing the risks and benefits, the patient was                            deemed in satisfactory condition to undergo the                            procedure.  After obtaining informed consent, the endoscope was                            passed under direct vision. Throughout the                            procedure, the patient's blood pressure, pulse, and                            oxygen saturations were monitored continuously. The                            GIF [id number] #[id number] was introduced through the                            mouth, and advanced to the second part of duodenum.                             The upper GI endoscopy was accomplished without                            difficulty. The patient tolerated the procedure                            well. Scope In: Scope Out: Findings:                 Normal esophageal mucosa. No endoscopic abnormality                            was evident in the esophagus to explain the                            patient's complaint of dysphagia. It was decided,                            however, to proceed with dilation of the entire                            esophagus. The scope was withdrawn. Dilation was                            performed with a Maloney dilator with mild                            resistance at 54 Fr.                           Moderate inflammation characterized by adherent                            blood, congestion (edema), erosions and erythema                            was found in the gastric antrum.  Biopsies were                            taken with a cold forceps for Helicobacter pylori                            testing.                           Mildly erythematous mucosa was found in the                            duodenal bulb.                           The second portion of the duodenum was normal. Complications:            No immediate complications. Estimated Blood Loss:     Estimated blood loss was minimal. Impression:               - Normal esophageal mucosa. No endoscopic                            esophageal abnormality to explain patient's                            dysphagia. Esophagus dilated with 54 Fr. Maloney.                           - Gastritis. Biopsied.                           - Erythematous duodenopathy in bulb (likely peptic).                           - Normal second portion of the duodenum. Recommendation:           - Patient has a contact number available for                            emergencies. The signs and symptoms of potential                             delayed complications were discussed with the                            patient. Return to normal activities tomorrow.                            Written discharge instructions were provided to the                            patient.                           - Resume previous diet.                           -  Continue present medications.                           - Await pathology results.                           - See the other procedure note for documentation of                            additional recommendations. Gordy CHRISTELLA Starch, MD 12/18/2024 2:23:02 PM This report has been signed electronically.

## 2024-12-19 ENCOUNTER — Telehealth: Payer: Self-pay

## 2024-12-19 NOTE — Telephone Encounter (Signed)
 Follow up call to pt, lm for pt to call if having any difficulty with normal activities or eating and drinking.  Also to call if any other questions or concerns.

## 2024-12-25 ENCOUNTER — Ambulatory Visit: Payer: Self-pay | Admitting: Internal Medicine

## 2024-12-25 LAB — SURGICAL PATHOLOGY

## 2025-01-11 ENCOUNTER — Encounter: Payer: Self-pay | Admitting: Family Medicine

## 2025-01-11 ENCOUNTER — Ambulatory Visit: Admitting: Family Medicine

## 2025-01-11 VITALS — BP 138/78 | HR 69 | Ht 68.0 in | Wt 255.0 lb

## 2025-01-11 DIAGNOSIS — M25512 Pain in left shoulder: Secondary | ICD-10-CM

## 2025-01-11 DIAGNOSIS — K760 Fatty (change of) liver, not elsewhere classified: Secondary | ICD-10-CM | POA: Diagnosis not present

## 2025-01-11 DIAGNOSIS — Z7985 Long-term (current) use of injectable non-insulin antidiabetic drugs: Secondary | ICD-10-CM

## 2025-01-11 DIAGNOSIS — Z6838 Body mass index (BMI) 38.0-38.9, adult: Secondary | ICD-10-CM

## 2025-01-11 DIAGNOSIS — G8929 Other chronic pain: Secondary | ICD-10-CM

## 2025-01-11 DIAGNOSIS — E1159 Type 2 diabetes mellitus with other circulatory complications: Secondary | ICD-10-CM

## 2025-01-11 NOTE — Progress Notes (Signed)
 "  Office: [phone]  /  Fax: [phone]  WEIGHT SUMMARY AND BIOMETRICS  Starting Date: 10/24/24  Starting Weight: 257lb   Weight Lost Since Last Visit: 0lb   Vitals BP: 138/78 Pulse Rate: 69 SpO2: 98 %   Body Composition  Body Fat %: 34.7 % Fat Mass (lbs): 88.6 lbs Muscle Mass (lbs): 158.8 lbs Total Body Water (lbs): 118 lbs Visceral Fat Rating : 23     HPI  Chief Complaint: OBESITY  Luke Sims is here to discuss his progress with his obesity treatment plan. He is on the the Category 4 Plan and states he is following his eating plan approximately 40 % of the time. He states he is exercising 0 minutes 0 times per week.   Interval History:  Since last office visit he is up 3 lb He has been healing from L shoulder surgery, in PT He did pause Mounjaro  for surgery and for EGD/ colonoscopy His son came to visit and he enjoyed good food/ sweets Hunger did increase with pausing Mounjaro  He has traveled He has a net weight loss of 2 lb in 2 mos of medically supervised weight management He is back on Mounjaro  12.5 mg weekly with some slight nausea He has improved appetite control He would would like to lose about 30-40 more pounds He is back to work He would like to add in more walking He stayed fairly active doing a remodeling project  Pharmacotherapy: Mounjaro  12.5 mg weekly, prescribed by his PCP for type 2 diabetes  PHYSICAL EXAM:  Blood pressure 138/78, pulse 69, height 5' 8 (1.727 m), weight 255 lb (115.7 kg), SpO2 98%. Body mass index is 38.77 kg/m.  General: He is overweight, cooperative, alert, well developed, and in no acute distress. PSYCH: Has normal mood, affect and thought process.   Lungs: Normal breathing effort, no conversational dyspnea.   ASSESSMENT AND PLAN  TREATMENT PLAN FOR OBESITY:  Recommended Dietary Goals  Luke Sims is currently in the action stage of change. As such, his goal is to continue weight management plan. He has agreed to the  Category 4 Plan. Resume category 4 meal plan  Behavioral Intervention  We discussed the following Behavioral Modification Strategies today: increasing lean protein intake to established goals, increasing fiber rich foods, increasing water intake , work on meal planning and preparation, keeping healthy foods at home, identifying sources and decreasing liquid calories, practice mindfulness eating and understand the difference between hunger signals and cravings, avoiding temptations and identifying enticing environmental cues, continue to work on implementation of reduced calorie nutritional plan, continue to practice mindfulness when eating, and planning for success.  Additional resources provided today: NA  Recommended Physical Activity Goals  Luke Sims has been advised to work up to 150 minutes of moderate intensity aerobic activity a week and strengthening exercises 2-3 times per week for cardiovascular health, weight loss maintenance and preservation of muscle mass.   He has agreed to Increase the intensity, frequency or duration of aerobic exercises   To increase walking time to 30 minutes most days of the week  Pharmacotherapy changes for the treatment of obesity: None  ASSOCIATED CONDITIONS ADDRESSED TODAY  Fatty liver Continue active plan for weight loss for the treatment of fatty liver Aim for at least a 10% total body weight loss Liver enzymes within normal limits on 08/25/2024  Morbid obesity (HCC)  resume category 4 meal plan along with use of Mounjaro  for type 2 diabetes  increase walking time  BMI 38.0-38.9,adult  Type 2  diabetes mellitus with other circulatory complication, without long-term current use of insulin  (HCC)  continue current medications per PCP for the treatment of type 2 diabetes  if struggling to see further weight loss, will refer to registered dietitian Lab Results  Component Value Date   HGBA1C 7.2 (H) 08/25/2024    Chronic left shoulder pain   improving status post left shoulder surgery on 11/14/2024.  He is currently in physical therapy.  He is able to do some walking without exacerbating pain.  Continued right shoulder pain does exist and limit his ability to do upper body resistance training.     He was informed of the importance of frequent follow up visits to maximize his success with intensive lifestyle modifications for his multiple health conditions.   ATTESTASTION STATEMENTS:  Reviewed by clinician on day of visit: allergies, medications, problem list, medical history, surgical history, family history, social history, and previous encounter notes pertinent to obesity diagnosis.     Darice Haddock, D.O. DABFM, DABOM [facility] [address] [place], [place] [address] [phone] "

## 2025-02-15 ENCOUNTER — Ambulatory Visit: Admitting: Family Medicine

## 2025-06-19 ENCOUNTER — Ambulatory Visit
